# Patient Record
Sex: Male | Born: 1935 | Race: White | Hispanic: No | Smoking: Never smoker
Health system: Southern US, Community
[De-identification: ages and names within clinical notes are randomized; demographics above are authoritative.]

## PROBLEM LIST (undated history)

## (undated) DIAGNOSIS — K625 Hemorrhage of anus and rectum: Secondary | ICD-10-CM

## (undated) DIAGNOSIS — I10 Essential (primary) hypertension: Secondary | ICD-10-CM

## (undated) DIAGNOSIS — I499 Cardiac arrhythmia, unspecified: Secondary | ICD-10-CM

## (undated) DIAGNOSIS — I4891 Unspecified atrial fibrillation: Secondary | ICD-10-CM

## (undated) DIAGNOSIS — K56609 Unspecified intestinal obstruction, unspecified as to partial versus complete obstruction: Secondary | ICD-10-CM

## (undated) HISTORY — PX: NO PAST SURGERIES: SHX2092

---

## 2005-09-13 ENCOUNTER — Emergency Department (HOSPITAL_COMMUNITY): Admission: EM | Admit: 2005-09-13 | Discharge: 2005-09-13 | Payer: Self-pay | Admitting: Emergency Medicine

## 2005-09-19 ENCOUNTER — Ambulatory Visit (HOSPITAL_COMMUNITY): Admission: RE | Admit: 2005-09-19 | Discharge: 2005-09-19 | Payer: Self-pay | Admitting: Internal Medicine

## 2012-06-17 ENCOUNTER — Inpatient Hospital Stay (HOSPITAL_COMMUNITY)
Admission: EM | Admit: 2012-06-17 | Discharge: 2012-06-20 | DRG: 379 | Disposition: A | Discharge status: Home / Self Care | Payer: Medicare Other | Attending: Internal Medicine | Admitting: Internal Medicine

## 2012-06-17 ENCOUNTER — Encounter (HOSPITAL_COMMUNITY): Payer: Self-pay | Admitting: *Deleted

## 2012-06-17 DIAGNOSIS — N400 Enlarged prostate without lower urinary tract symptoms: Secondary | ICD-10-CM | POA: Diagnosis present

## 2012-06-17 DIAGNOSIS — D649 Anemia, unspecified: Secondary | ICD-10-CM | POA: Diagnosis present

## 2012-06-17 DIAGNOSIS — K620 Anal polyp: Secondary | ICD-10-CM | POA: Diagnosis present

## 2012-06-17 DIAGNOSIS — I4891 Unspecified atrial fibrillation: Secondary | ICD-10-CM

## 2012-06-17 DIAGNOSIS — K5731 Diverticulosis of large intestine without perforation or abscess with bleeding: Principal | ICD-10-CM | POA: Diagnosis present

## 2012-06-17 DIAGNOSIS — R509 Fever, unspecified: Secondary | ICD-10-CM | POA: Diagnosis present

## 2012-06-17 DIAGNOSIS — N401 Enlarged prostate with lower urinary tract symptoms: Secondary | ICD-10-CM | POA: Diagnosis present

## 2012-06-17 DIAGNOSIS — K922 Gastrointestinal hemorrhage, unspecified: Secondary | ICD-10-CM

## 2012-06-17 DIAGNOSIS — E876 Hypokalemia: Secondary | ICD-10-CM | POA: Diagnosis present

## 2012-06-17 DIAGNOSIS — R350 Frequency of micturition: Secondary | ICD-10-CM | POA: Diagnosis present

## 2012-06-17 DIAGNOSIS — N138 Other obstructive and reflux uropathy: Secondary | ICD-10-CM | POA: Diagnosis present

## 2012-06-17 DIAGNOSIS — Z7901 Long term (current) use of anticoagulants: Secondary | ICD-10-CM

## 2012-06-17 DIAGNOSIS — K625 Hemorrhage of anus and rectum: Secondary | ICD-10-CM

## 2012-06-17 DIAGNOSIS — I10 Essential (primary) hypertension: Secondary | ICD-10-CM | POA: Diagnosis present

## 2012-06-17 HISTORY — DX: Hemorrhage of anus and rectum: K62.5

## 2012-06-17 HISTORY — DX: Cardiac arrhythmia, unspecified: I49.9

## 2012-06-17 HISTORY — DX: Unspecified atrial fibrillation: I48.91

## 2012-06-17 HISTORY — DX: Essential (primary) hypertension: I10

## 2012-06-17 LAB — COMPREHENSIVE METABOLIC PANEL
Alkaline Phosphatase: 46 U/L (ref 39–117)
Chloride: 102 mEq/L (ref 96–112)
GFR calc Af Amer: 78 mL/min — ABNORMAL LOW (ref >90)
Sodium: 141 mEq/L (ref 135–145)
Total Protein: 6.7 g/dL (ref 6.0–8.3)

## 2012-06-17 LAB — CBC
Hemoglobin: 8.7 g/dL — ABNORMAL LOW (ref 13.0–17.0)
Hemoglobin: 7.5 g/dL — ABNORMAL LOW (ref 13.0–17.0)
MCH: 29.1 pg (ref 26.0–34.0)
MCHC: 34.1 g/dL (ref 30.0–36.0)
MCHC: 34.5 g/dL (ref 30.0–36.0)
MCHC: 34.4 g/dL (ref 30.0–36.0)
MCV: 85.6 fL (ref 78.0–100.0)
Platelets: 145 10*3/uL — ABNORMAL LOW (ref 150–400)
Platelets: 141 10*3/uL — ABNORMAL LOW (ref 150–400)
Platelets: 127 10*3/uL — ABNORMAL LOW (ref 150–400)
Platelets: 129 10*3/uL — ABNORMAL LOW (ref 150–400)
RBC: 2.52 MIL/uL — ABNORMAL LOW (ref 4.22–5.81)
RBC: 2.56 MIL/uL — ABNORMAL LOW (ref 4.22–5.81)
RDW: 13.6 % (ref 11.5–15.5)
RDW: 13.7 % (ref 11.5–15.5)
WBC: 7.9 10*3/uL (ref 4.0–10.5)
WBC: 8 10*3/uL (ref 4.0–10.5)

## 2012-06-17 LAB — SAMPLE TO BLOOD BANK

## 2012-06-17 LAB — CBC WITH DIFFERENTIAL/PLATELET
HCT: 30.7 % — ABNORMAL LOW (ref 39.0–52.0)
Hemoglobin: 10.4 g/dL — ABNORMAL LOW (ref 13.0–17.0)
Lymphs Abs: 1.5 10*3/uL (ref 0.7–4.0)
MCH: 28.8 pg (ref 26.0–34.0)
MCHC: 33.9 g/dL (ref 30.0–36.0)
MCV: 85 fL (ref 78.0–100.0)
Monocytes Absolute: 0.4 10*3/uL (ref 0.1–1.0)
Platelets: 149 10*3/uL — ABNORMAL LOW (ref 150–400)
RBC: 3.61 MIL/uL — ABNORMAL LOW (ref 4.22–5.81)
RDW: 13.4 % (ref 11.5–15.5)
WBC: 7.6 10*3/uL (ref 4.0–10.5)

## 2012-06-17 LAB — PROTIME-INR
INR: 2.84 — ABNORMAL HIGH (ref 0.00–1.49)
INR: 3.07 — ABNORMAL HIGH (ref 0.00–1.49)
Prothrombin Time: 28.4 seconds — ABNORMAL HIGH (ref 11.6–15.2)
Prothrombin Time: 30.1 seconds — ABNORMAL HIGH (ref 11.6–15.2)

## 2012-06-17 MED ORDER — ONDANSETRON HCL 4 MG PO TABS: 4.0000 mg | ORAL_TABLET | Freq: Four times a day (QID) | ORAL | Status: DC | PRN

## 2012-06-17 MED ORDER — VITAMIN K1 10 MG/ML IJ SOLN
10.0000 mg | Freq: Once | INTRAMUSCULAR | Status: AC
  Administered 2012-06-17: 10 mg via SUBCUTANEOUS
  Filled 2012-06-17: qty 1

## 2012-06-17 MED ORDER — ACETAMINOPHEN 325 MG PO TABS
650.0000 mg | ORAL_TABLET | Freq: Four times a day (QID) | ORAL | Status: DC | PRN
  Administered 2012-06-19: 650 mg via ORAL
  Filled 2012-06-17: qty 2

## 2012-06-17 MED ORDER — SODIUM CHLORIDE 0.9 % IV SOLN: INTRAVENOUS | Status: AC

## 2012-06-17 MED ORDER — PEG 3350-KCL-NA BICARB-NACL 420 G PO SOLR
4000.0000 mL | Freq: Once | ORAL | Status: AC
  Administered 2012-06-17: 4000 mL via ORAL
  Filled 2012-06-17: qty 4000

## 2012-06-17 MED ORDER — ACETAMINOPHEN 650 MG RE SUPP: 650.0000 mg | Freq: Four times a day (QID) | RECTAL | Status: DC | PRN

## 2012-06-17 MED ORDER — ALUM & MAG HYDROXIDE-SIMETH 200-200-20 MG/5ML PO SUSP
30.0000 mL | Freq: Four times a day (QID) | ORAL | Status: DC | PRN
  Filled 2012-06-17: qty 30

## 2012-06-17 MED ORDER — ONDANSETRON HCL 4 MG/2ML IJ SOLN: 4.0000 mg | Freq: Four times a day (QID) | INTRAMUSCULAR | Status: DC | PRN

## 2012-06-17 MED ORDER — VERAPAMIL HCL ER 240 MG PO TBCR
240.0000 mg | EXTENDED_RELEASE_TABLET | Freq: Every day | ORAL | Status: DC
  Administered 2012-06-19: 240 mg via ORAL
  Filled 2012-06-17 (×4): qty 1

## 2012-06-17 MED ORDER — PHYTONADIONE 1 MG/0.5 ML ORAL SOLUTION
1.0000 mg | Freq: Once | ORAL | Status: AC
  Administered 2012-06-17: 1 mg via ORAL
  Filled 2012-06-17: qty 0.5

## 2012-06-17 MED ORDER — SODIUM CHLORIDE 0.9 % IV SOLN
INTRAVENOUS | Status: DC
  Administered 2012-06-17 – 2012-06-18 (×2): via INTRAVENOUS

## 2012-06-17 NOTE — ED Notes (Signed)
PT is here with rectal bleeding that started yesterday evening.  Pt is on coumadin.  No abdominal pain

## 2012-06-17 NOTE — ED Provider Notes (Signed)
History     CSN: [id number]  Arrival date & time 06/17/12  0702   First MD Initiated Contact with Patient 06/17/12 0719      Chief Complaint  Patient presents with  . Rectal Bleeding    (Consider location/radiation/quality/duration/timing/severity/associated sxs/prior treatment) Patient is a 79 y.o. male presenting with hematochezia. The history is provided by the patient.  Rectal Bleeding  The current episode started yesterday. Pertinent negatives include no abdominal pain, no diarrhea, no nausea, no vomiting, no chest pain, no headaches and no rash.   patient presents rectal bleeding last night. States initially the stool with some blood. Now is more red blood on its own. He is without pain. He is on Coumadin for it to fibrillation, however he states his level stays between 22 and 2.8. No other bleeding. No chest pain or lightheadedness. No abdominal pain. He states he has had some episodes of bleeding like this previously but that was resolved on their own. No weight loss. The  Past Medical History  Diagnosis Date  . Hypertension   . Arrhythmia   . Atrial fibrillation   . Rectal bleeding 06/17/2012    Past Surgical History  Procedure Date  . None   . No past surgeries     Family History  Problem Relation Age of Onset  . Diabetes Brother     History  Substance Use Topics  . Smoking status: Never Smoker   . Smokeless tobacco: Never Used  . Alcohol Use: Yes     Comment: rare      Review of Systems  Constitutional: Negative for activity change and appetite change.  HENT: Negative for neck stiffness.   Eyes: Negative for pain.  Respiratory: Negative for chest tightness and shortness of breath.   Cardiovascular: Negative for chest pain and leg swelling.  Gastrointestinal: Positive for blood in stool, hematochezia and anal bleeding. Negative for nausea, vomiting, abdominal pain and diarrhea.  Genitourinary: Negative for flank pain.  Musculoskeletal: Negative for  back pain.  Skin: Negative for rash.  Neurological: Negative for weakness, numbness and headaches.  Psychiatric/Behavioral: Negative for behavioral problems.    Allergies  Review of patient's allergies indicates no known allergies.  Home Medications  No current outpatient prescriptions on file.  BP 145/75  Pulse 76  Temp 97.8 F (36.6 C) (Oral)  Resp 20  Ht 5' 10.5" (1.791 m)  Wt 215 lb 9.8 oz (97.8 kg)  BMI 30.50 kg/m2  SpO2 100%  Physical Exam  Nursing note and vitals reviewed. Constitutional: He is oriented to person, place, and time. He appears well-developed and well-nourished.  HENT:  Head: Normocephalic and atraumatic.  Eyes: EOM are normal. Pupils are equal, round, and reactive to light.  Neck: Normal range of motion. Neck supple.  Cardiovascular: Normal rate, regular rhythm and normal heart sounds.   No murmur heard. Pulmonary/Chest: Effort normal and breath sounds normal.  Abdominal: Soft. Bowel sounds are normal. He exhibits no distension and no mass. There is no tenderness. There is no rebound and no guarding.  Musculoskeletal: Normal range of motion. He exhibits no edema.  Neurological: He is alert and oriented to person, place, and time. No cranial nerve deficit.  Skin: Skin is warm and dry.  Psychiatric: He has a normal mood and affect.    ED Course  Procedures (including critical care time)  Labs Reviewed  CBC WITH DIFFERENTIAL - Abnormal; Notable for the following:    RBC 3.61 (*)     Hemoglobin 10.4 (*)  HCT 30.7 (*)     Platelets 149 (*)     All other components within normal limits  COMPREHENSIVE METABOLIC PANEL - Abnormal; Notable for the following:    Potassium 3.4 (*)     Glucose, Bld 158 (*)     GFR calc non Af Amer 67 (*)     GFR calc Af Amer 78 (*)     All other components within normal limits  PROTIME-INR - Abnormal; Notable for the following:    Prothrombin Time 28.4 (*)     INR 2.84 (*)     All other components within normal  limits  CBC - Abnormal; Notable for the following:    RBC 3.26 (*)     Hemoglobin 9.5 (*)     HCT 27.9 (*)     Platelets 145 (*)     All other components within normal limits  SAMPLE TO BLOOD BANK  CBC  CBC   No results found.   1. GI bleed   2. Rectal bleeding   3. Anemia   4. Atrial fibrillation       MDM  Patient presents with GI bleed. Hemoglobin is barely below normal, however he is on Coumadin for atrial fibrillation. Vitals are reassuring, however with the anticoagulation I feel he needs admission for further evaluation and treatment.        Juliet Rude. Rubin Payor, MD 06/17/12 [id number]

## 2012-06-17 NOTE — ED Notes (Signed)
Admitting physician at the bedside.

## 2012-06-17 NOTE — Consult Note (Signed)
Referring Provider: Dr. Brendia Sacks Primary Care Physician:  Georgann Housekeeper, MD Primary Gastroenterologist:  Gentry Fitz  Reason for Consultation:  Hematochezia  HPI: Ricky Guzman is a 79 y.o. male admitted to the hospital today following a roughly 24 hour history of intermittent painless hematochezia, several episodes, not associated with syncope. It sounds as though he he may have had a flexible sigmoidoscopy in the distant past, perhaps 15 years ago, but he does not know if he has diverticular disease and I do not see any radiographic studies such as CT scans which would show whether or not diverticular disease is present. He presented with a therapeutic INR of 2.8  (on Coumadin for A. Fib) and hemoglobin of 10.4, as compared to his usual baseline in the mid-12's, most recently checked in the office of his primary physician about a year ago. BUN normal, arguing against an upper tract source of bleeding, and he is not on any ulcerogenic medications.   Past Medical History  Diagnosis Date  . Hypertension   . Arrhythmia   . Atrial fibrillation   . Rectal bleeding 06/17/2012    Past Surgical History  Procedure Date  . None   . No past surgeries     Prior to Admission medications   Medication Sig Start Date End Date Taking? Authorizing Provider  benazepril-hydrochlorthiazide (LOTENSIN HCT) 20-12.5 MG per tablet Take 1 tablet by mouth daily.   Yes Historical Provider, MD  terazosin (HYTRIN) 10 MG capsule Take 10 mg by mouth at bedtime.   Yes Historical Provider, MD  verapamil (CALAN-SR) 240 MG CR tablet Take 240 mg by mouth at bedtime.   Yes Historical Provider, MD  warfarin (COUMADIN) 5 MG tablet Take 2.5-5 mg by mouth daily. 5 mg on all days except 2.5 mg on Monday, Wednesday, and Friday   Yes Historical Provider, MD    Current Facility-Administered Medications  Medication Dose Route Frequency Provider Last Rate Last Dose  . 0.9 %  sodium chloride infusion   Intravenous STAT Juliet Rude. Pickering, MD      . 0.9 %  sodium chloride infusion   Intravenous Continuous Standley Brooking, MD 50 mL/hr at 06/17/12 1800    . acetaminophen (TYLENOL) tablet 650 mg  650 mg Oral Q6H PRN Standley Brooking, MD       Or  . acetaminophen (TYLENOL) suppository 650 mg  650 mg Rectal Q6H PRN Standley Brooking, MD      . alum & mag hydroxide-simeth (MAALOX/MYLANTA) 200-200-20 MG/5ML suspension 30 mL  30 mL Oral Q6H PRN Standley Brooking, MD      . ondansetron [facility]) tablet 4 mg  4 mg Oral Q6H PRN Standley Brooking, MD       Or  . ondansetron [facility]) injection 4 mg  4 mg Intravenous Q6H PRN Standley Brooking, MD      . polyethylene glycol-electrolytes (NuLYTELY/GoLYTELY) solution 4,000 mL  4,000 mL Oral Once Florencia Reasons, MD      . verapamil (CALAN-SR) CR tablet 240 mg  240 mg Oral QHS Standley Brooking, MD        Allergies as of 06/17/2012  . (No Known Allergies)    Family History  Problem Relation Age of Onset  . Diabetes Brother     History   Social History  . Marital Status: Married    Spouse Name: N/A    Number of Children: N/A  . Years of Education: N/A   Occupational History  . Not  on file.   Social History Main Topics  . Smoking status: Never Smoker   . Smokeless tobacco: Never Used  . Alcohol Use: Yes     Comment: rare  . Drug Use: No  . Sexually Active:    Other Topics Concern  . Not on file   Social History Narrative  . No narrative on file    Review of Systems: Negative for upper tract symptoms anorexia, or weight loss   Physical Exam: Vital signs in last 24 hours: Temp:  [97.6 F (36.4 C)-98.3 F (36.8 C)] 98.3 F (36.8 C) (01/13 1622) Pulse Rate:  [48-114] 76  (01/13 1622) Resp:  [18-20] 18  (01/13 1622) BP: (107-145)/(59-75) 107/59 mmHg (01/13 1622) SpO2:  [99 %-100 %] 100 % (01/13 1622) Weight:  [97.8 kg (215 lb 9.8 oz)] 97.8 kg (215 lb 9.8 oz) (01/13 1200) Last BM Date: 06/17/12 General:   Alert,  Well-developed, well-nourished,  pleasant and cooperative in NAD Head:  Normocephalic and atraumatic. Eyes:  Sclera clear, no icterus.    Lungs:  Clear throughout to auscultation.   No wheezes, crackles, or rhonchi. No evident respiratory distress. Heart:   Regular rate and  seemingly normal rhythm; no murmurs, clicks, rubs,  or gallops. Abdomen: somewhat adipose. Soft, nontender, and nondistended. No masses, hepatosplenomegaly or ventral hernias noted. Normal bowel sounds, without bruits, guarding, or rebound.   Rectal:   per ER physician, showed bright red blood Neurologic:  Alert and coherent;  grossly normal neurologically. Psych:   Alert and cooperative. Normal mood and affect.  Intake/Output from previous day:   Intake/Output this shift:    Lab Results:  Basename 06/17/12 1632 06/17/12 1122 06/17/12 0720  WBC 7.2 7.9 7.6  HGB 8.7* 9.5* 10.4*  HCT 25.2* 27.9* 30.7*  PLT 141* 145* 149*   BMET  Basename 06/17/12 0720  NA 141  K 3.4*  CL 102  CO2 24  GLUCOSE 158*  BUN 18  CREATININE 1.02  CALCIUM 9.5   LFT  Basename 06/17/12 0720  PROT 6.7  ALBUMIN 3.6  AST 15  ALT 8  ALKPHOS 46  BILITOT 0.5  BILIDIR --  IBILI --   PT/INR  Basename 06/17/12 0720  LABPROT 28.4*  INR 2.84*    Studies/Results: No results found.  Impression: The clinical presentation is very compatible with a diverticular bleed. Vascular ectasia on an anticoagulated patient would be another possibility, and, much less likely, neoplasia.  Plan:  colonoscopy tomorrow. The nature, purpose, and risks have been reviewed with the patient and he is agreeable to proceed.   LOS: 0 days   Qamar Rosman V  06/17/2012, 7:03 PM

## 2012-06-17 NOTE — ED Notes (Signed)
Pt ambulated back from the BR. Dark red blood present in the commode. Dr. Rubin Payor made aware.

## 2012-06-17 NOTE — ED Notes (Signed)
Dr. Rubin Payor back in the family and patient.

## 2012-06-17 NOTE — Progress Notes (Signed)
Received pt. From emergency room.pt. Alert and oriented.pt. From home alone.skin intact.pt. Does not uses any equipment to move at home.

## 2012-06-17 NOTE — Progress Notes (Signed)
Pt. Has used the bathroom twice since got to the floor,the second time fresh red blood was witness by the RN.MD. Was notified.keep monitoring pt. Closely and assessing his needs.

## 2012-06-17 NOTE — ED Notes (Signed)
PATIENT HAS ARRIVED ON POD C. HIS BED IS READY.

## 2012-06-17 NOTE — ED Notes (Signed)
[facility] HAS CALLED REPORT. PT WILL BE TAKEN TO HIS ROOM NOW

## 2012-06-17 NOTE — H&P (Signed)
History and Physical  Ricky Guzman ZOX:[id number] DOB: November 30, 1930 DOA: 06/17/2012  Referring physician: Benjiman Core, MD PCP: Georgann Housekeeper, MD   Chief Complaint: Rectal bleeding  HPI:  79 year old man on warfarin for atrial fibrillation presented with two-day history of rectal bleeding and was referred for admission.  History obtained from patient who is an excellent historian. Symptoms began yesterday with 2 episodes of rectal bleeding, somewhat dark blood. Throughout the night he had several more episodes with bowel movements. No abdominal pain. No nausea or vomiting. He has had rectal bleeding in the past which was self-limited. He reports colonoscopy sometime in the past at [facility] although he cannot recall the date. Denies history of hemorrhoids or diverticulosis. He has never had a blood clot or stroke.  In the emergency department noted to be afebrile with stable vital signs. Guaiac was grossly positive. Hemoglobin was 10.4 with no baseline to refer to. INR 2.84.  Review of Systems:  Negative for fever, visual changes, sore throat, rash, new muscle aches, chest pain, SOB, dysuria, n/v/abdominal pain.   Past Medical History  Diagnosis Date  . Hypertension   . Arrhythmia   . Atrial fibrillation     Past Surgical History  Procedure Date  . None     Social History:  reports that he has never smoked. He does not have any smokeless tobacco history on file. He reports that he drinks alcohol. He reports that he does not use illicit drugs.  No Known Allergies  Family History  Problem Relation Age of Onset  . Diabetes Brother      Prior to Admission medications   Medication Sig Start Date End Date Taking? Authorizing Provider  benazepril-hydrochlorthiazide (LOTENSIN HCT) 20-12.5 MG per tablet Take 1 tablet by mouth daily.   Yes Historical Provider, MD  terazosin (HYTRIN) 10 MG capsule Take 10 mg by mouth at bedtime.   Yes Historical Provider, MD  verapamil (CALAN-SR) 240  MG CR tablet Take 240 mg by mouth at bedtime.   Yes Historical Provider, MD  warfarin (COUMADIN) 5 MG tablet Take 2.5-5 mg by mouth daily. 5 mg on all days except 2.5 mg on Monday, Wednesday, and Friday   Yes Historical Provider, MD   Physical Exam: Filed Vitals:   06/17/12 0708 06/17/12 0800 06/17/12 0834  BP: 136/75 120/68 122/63  Pulse: 89 67 71  Temp: 97.6 F (36.4 C)    TempSrc: Oral    Resp: 18  20  SpO2: 100% 99% 100%    General:  Examined in the emergency department. Appears calm and comfortable Eyes: PERRL, normal lids, irises  ENT: Moderately hard of hearing, normal lips & tongue Neck: no LAD, masses or thyromegaly Cardiovascular: RRR, no m/r/g. No LE edema. Respiratory: CTA bilaterally, no w/r/r. Normal respiratory effort. Abdomen: soft, ntnd GU: Anus appears unremarkable. No hemorrhoids. Gross red blood with rectal examination. Prostate feels unremarkable. Skin: no rash or induration seen on limited exam Musculoskeletal: grossly normal tone BUE/BLE Psychiatric: grossly normal mood and affect, speech fluent and appropriate Neurologic: grossly non-focal.  Wt Readings from Last 3 Encounters:  No data found for Wt    Labs on Admission:  Basic Metabolic Panel:  Lab 06/17/12 8119  NA 141  K 3.4*  CL 102  CO2 24  GLUCOSE 158*  BUN 18  CREATININE 1.02  CALCIUM 9.5  MG --  PHOS --    Liver Function Tests:  Lab 06/17/12 0720  AST 15  ALT 8  ALKPHOS 46  BILITOT 0.5  PROT 6.7  ALBUMIN 3.6    CBC:  Lab 06/17/12 0720  WBC 7.6  NEUTROABS 5.5  HGB 10.4*  HCT 30.7*  MCV 85.0  PLT 149*    Radiological Exams on Admission: No results found.     Principal Problem:  *Rectal bleeding Active Problems:  Anemia  Atrial fibrillation   Assessment/Plan 1. Rectal bleeding: Serial CBC, GI consultation placed with [facility], IV fluids. 2. Anemia: No baseline. Acute blood loss anemia suspected. Serial CBC. Transfuse as needed for continued bleeding for  significant drop. 3. Atrial fibrillation: Discontinue warfarin. Continue verapamil. Low-dose vitamin K given history of bleeding and anemia. No history of stroke or blood clot. I did counsel the patient and family that stopping warfarin and partially reversing INR does involve a risk of stroke which they accepted given the risk-benefit of the current situation.  I have notified the patient's primary care physician of admission, assume care 1/14.  Code Status: Full code Family Communication: discussed with 2 daughters at bedside Disposition Plan/Anticipated LOS: admit for observation, 1-2 days  Time spent: 60 minutes  Brendia Sacks, MD  [facility] Pager [phone] 06/17/2012, 8:56 AM

## 2012-06-18 ENCOUNTER — Encounter (HOSPITAL_COMMUNITY): Payer: Self-pay | Admitting: *Deleted

## 2012-06-18 ENCOUNTER — Encounter (HOSPITAL_COMMUNITY)
Admission: EM | Disposition: A | Discharge status: Home / Self Care | Payer: Self-pay | Source: Home / Self Care | Attending: Internal Medicine

## 2012-06-18 DIAGNOSIS — I10 Essential (primary) hypertension: Secondary | ICD-10-CM

## 2012-06-18 HISTORY — PX: COLONOSCOPY: SHX5424

## 2012-06-18 LAB — CBC
MCH: 28.5 pg (ref 26.0–34.0)
Platelets: 116 10*3/uL — ABNORMAL LOW (ref 150–400)
RBC: 2.14 MIL/uL — ABNORMAL LOW (ref 4.22–5.81)
RDW: 13.9 % (ref 11.5–15.5)
WBC: 8.2 10*3/uL (ref 4.0–10.5)

## 2012-06-18 LAB — BASIC METABOLIC PANEL
CO2: 23 mEq/L (ref 19–32)
Chloride: 103 mEq/L (ref 96–112)
GFR calc Af Amer: 88 mL/min — ABNORMAL LOW (ref >90)
Potassium: 3.2 mEq/L — ABNORMAL LOW (ref 3.5–5.1)
Sodium: 139 mEq/L (ref 135–145)

## 2012-06-18 LAB — PROTIME-INR
INR: 1.94 — ABNORMAL HIGH (ref 0.00–1.49)
Prothrombin Time: 21.4 seconds — ABNORMAL HIGH (ref 11.6–15.2)
Prothrombin Time: 20.1 seconds — ABNORMAL HIGH (ref 11.6–15.2)

## 2012-06-18 SURGERY — COLONOSCOPY
Anesthesia: Moderate Sedation

## 2012-06-18 MED ORDER — MIDAZOLAM HCL 5 MG/5ML IJ SOLN
INTRAMUSCULAR | Status: DC | PRN
  Administered 2012-06-18 (×2): 2 mg via INTRAVENOUS
  Administered 2012-06-18: 1 mg via INTRAVENOUS

## 2012-06-18 MED ORDER — FENTANYL CITRATE 0.05 MG/ML IJ SOLN
INTRAMUSCULAR | Status: AC
  Filled 2012-06-18: qty 2

## 2012-06-18 MED ORDER — MIDAZOLAM HCL 5 MG/ML IJ SOLN
INTRAMUSCULAR | Status: AC
  Filled 2012-06-18: qty 2

## 2012-06-18 MED ORDER — FENTANYL CITRATE 0.05 MG/ML IJ SOLN
INTRAMUSCULAR | Status: DC | PRN
  Administered 2012-06-18 (×3): 25 ug via INTRAVENOUS

## 2012-06-18 MED ORDER — POTASSIUM CHLORIDE 10 MEQ/100ML IV SOLN
10.0000 meq | INTRAVENOUS | Status: AC
  Administered 2012-06-18 (×2): 10 meq via INTRAVENOUS
  Filled 2012-06-18 (×2): qty 100

## 2012-06-18 MED ORDER — POTASSIUM CHLORIDE 10 MEQ/100ML IV SOLN
10.0000 meq | INTRAVENOUS | Status: AC
  Filled 2012-06-18 (×2): qty 100

## 2012-06-18 NOTE — Progress Notes (Signed)
Colonoscopy was very well tolerated. Please see dictated procedure report for details.  The patient has pancolonic diverticulosis, but had no bleeding at the time of the procedure, so it appears he has stopped.   However, in my experience, it is not uncommon for these patients to restart after a day or 2, so I would recommend keeping him in the hospital at least until Thursday morning.  A small polyp was removed and I will contact the patient with the pathology report on it, but in view of his advanced age, I do not anticipate him needing a followup colonoscopy, regardless of the histology of the polyp.   Okay to resume anticoagulation at any time if needed. However, there is a moderate advantage to keeping him off Coumadin for a week or so to allow the presumed clot on his diverticulum to fully mature, and it would be preferable in the event of an early rebleed. However, the risk of re-bleeding has to be balanced against the risk of having him without anticoagulation.  I will leave this to the discretion of his attending physician.  Florencia Reasons, M.D. [phone]

## 2012-06-18 NOTE — Op Note (Signed)
[facility] [address] [place], [address]   COLONOSCOPY PROCEDURE REPORT  PATIENT: Ricky Guzman, Ricky Guzman  MR#: [id number] BIRTHDATE: 21-Dec-1930 , 82  yrs. old GENDER: Male ENDOSCOPIST: Bernette Redbird, MD REFERRED BY:   Dr. Georgann Housekeeper PROCEDURE DATE:  06/18/2012 PROCEDURE:     colonoscopy with polypectomy ASA CLASS: INDICATIONS:  significant hematochezia requiring transfusion MEDICATIONS:    fentanyl 75 mcg, Versed 5 mg IV  DESCRIPTION OF PROCEDURE: the patient was brought from his hospital room to the endoscopy unit after a Nulytely prep, having provided written consent after discussion of the risks of the procedure. Time-out was performed and the patient was sedatedand remained stable throughout the procedure. Digital exam of the prostate was normal.  The Pentax adult video colonoscope was easily advanced to the cecum as identified by visualization of appendiceal orifice and ileocecal valve and pullback was then performed. The quality of the prep was excellent it is felt that all areas were well seen.  There was no blood whatsoever in the colonic lumen, consistent with the patient's report that his stools had cleared during the early part of the prep yesterday evening.  The patient had multiple diverticula throughout the colon, most concentrated in the sigmoid region and, to a lesser degree, in the proximal colon.  There was a small, 5 mm semi-pedunculated polyp in the distal rectum seen on retroflexed view in, removed by cold snare technique and touched up with the cold biopsy forceps to the point of complete excision, with minimal bleeding.  The patient's INR at the time this procedure was 1.7, with partial reversal of his Coumadin at the time of admission.  The patient tolerated the procedure well.    COMPLICATIONS: None  ENDOSCOPIC IMPRESSION:  1. No active bleeding at time of procedure or blood in the colonic lumen 2. Multiple  diverticula throughout the colon, which are the presumed source of patient's bleeding 3. Small distal rectal polyp removed as described above  RECOMMENDATIONS:  1. Await pathology on the polyp. However, in view of the patient's advanced age and the small size of the polyp, I do not feel that surveillance colonoscopy would be needed, even if the polyp were adenomatous in character  2. Okay to advance diet  3. Consider discharge in another 36 hours, keeping in mind that diverticular bleeding is sometimes quiescent for a day or 2, and then will restart    _______________________________ eSigned:  Bernette Redbird, MD 06/18/2012 4:45 PM     PATIENT NAME:  Ricky Guzman, Ricky Guzman MR#: [id number]

## 2012-06-18 NOTE — Progress Notes (Signed)
Subjective: Pt with bowel prep; dark stool No N/V/Abdominal pain, no CP, no SOB   Objective: Vital signs in last 24 hours: Temp:  [97.1 F (36.2 C)-98.6 F (37 C)] 98.4 F (36.9 C) (01/14 0539) Pulse Rate:  [48-114] 106  (01/14 0539) Resp:  [16-20] 16  (01/14 0539) BP: (107-145)/(59-85) 136/81 mmHg (01/14 0539) SpO2:  [98 %-100 %] 100 % (01/14 0447) Weight:  [97.5 kg (214 lb 15.2 oz)-97.8 kg (215 lb 9.8 oz)] 97.5 kg (214 lb 15.2 oz) (01/13 2217) Weight change:  Last BM Date: 06/17/12  Intake/Output from previous day: 01/13 0701 - 01/14 0700 In: 1240 [P.O.:740; I.V.:500] Out: -  Intake/Output this shift:    General appearance: alert Resp: clear to auscultation bilaterally Cardio:Irregular irregular, mild tacycardia GI: soft, non-tender; bowel sounds normal; no masses,  no organomegaly  Lab Results:  [facility] 06/17/12 2232 06/17/12 1632  WBC 8.08.0 7.2  HGB 7.5*7.5* 8.7*  HCT 21.5*21.8* 25.2*  PLT 129*127* 141*   BMET  Basename 06/17/12 0720  NA 141  K 3.4*  CL 102  CO2 24  GLUCOSE 158*  BUN 18  CREATININE 1.02  CALCIUM 9.5    Studies/Results: No results found.  Medications: I have reviewed the patient's current medications.  Assessment/Plan: LGI bleed: DDX diverticular, AVM- colon eval with GI Anemia: loss of blood- transfuse 2 unit PRBC Anticoagulation: inr 3.0- getting FFP; recheck INR after FFP- if still over 1.5 consider FFP/ Vit K HTN: BP med- stable Afib: rate control    LOS: 1 day   Ladarryl Wrage 06/18/2012, 8:02 AM

## 2012-06-18 NOTE — Progress Notes (Signed)
Called Dr Arthur Holms about patient temperature of 100.52F and the patient refusing to take medications. No new orders.

## 2012-06-18 NOTE — Progress Notes (Signed)
Patient had a loose bowel movement that is dark red d/t frank red blood.  Abdomen is soft with audible bowel sounds and no tenderness.  He is now back in bed resting.  Currently getting FFP.  Will continue to monitor

## 2012-06-19 ENCOUNTER — Encounter (HOSPITAL_COMMUNITY): Payer: Self-pay | Admitting: Gastroenterology

## 2012-06-19 LAB — URINALYSIS, ROUTINE W REFLEX MICROSCOPIC
Bilirubin Urine: NEGATIVE
Ketones, ur: NEGATIVE mg/dL
Nitrite: NEGATIVE
Protein, ur: NEGATIVE mg/dL
Urobilinogen, UA: 1 mg/dL (ref 0.0–1.0)
pH: 8 (ref 5.0–8.0)

## 2012-06-19 LAB — PREPARE FRESH FROZEN PLASMA: Unit division: 0

## 2012-06-19 LAB — CBC
HCT: 22.8 % — ABNORMAL LOW (ref 39.0–52.0)
MCH: 29.3 pg (ref 26.0–34.0)
MCHC: 34.6 g/dL (ref 30.0–36.0)
MCV: 84.4 fL (ref 78.0–100.0)
Platelets: 117 10*3/uL — ABNORMAL LOW (ref 150–400)
RDW: 14.4 % (ref 11.5–15.5)

## 2012-06-19 LAB — BASIC METABOLIC PANEL
BUN: 10 mg/dL (ref 6–23)
Calcium: 7.8 mg/dL — ABNORMAL LOW (ref 8.4–10.5)
Creatinine, Ser: 0.98 mg/dL (ref 0.50–1.35)
GFR calc Af Amer: 88 mL/min — ABNORMAL LOW (ref >90)
GFR calc non Af Amer: 76 mL/min — ABNORMAL LOW (ref >90)

## 2012-06-19 MED ORDER — TERAZOSIN HCL 5 MG PO CAPS
10.0000 mg | ORAL_CAPSULE | Freq: Every day | ORAL | Status: DC
  Administered 2012-06-19: 10 mg via ORAL
  Filled 2012-06-19 (×2): qty 2

## 2012-06-19 MED ORDER — POTASSIUM CHLORIDE CRYS ER 20 MEQ PO TBCR
40.0000 meq | EXTENDED_RELEASE_TABLET | Freq: Once | ORAL | Status: AC
  Administered 2012-06-19: 40 meq via ORAL
  Filled 2012-06-19: qty 2

## 2012-06-19 NOTE — Progress Notes (Signed)
Subjective: Feel better No pain. Low grade fever C/o frequent urine  Objective: Vital signs in last 24 hours: Temp:  [98.1 F (36.7 C)-100.8 F (38.2 C)] 100.2 F (37.9 C) (01/15 0509) Pulse Rate:  [99-105] 102  (01/15 0509) Resp:  [17-31] 17  (01/15 0509) BP: (135-168)/(76-99) 168/91 mmHg (01/15 0509) SpO2:  [97 %-100 %] 98 % (01/15 0509) Weight:  [97.9 kg (215 lb 13.3 oz)] 97.9 kg (215 lb 13.3 oz) (01/14 2114) Weight change: 0.1 kg (3.5 oz) Last BM Date: 06/18/12  Intake/Output from previous day: 01/14 0701 - 01/15 0700 In: 875 [I.V.:875] Out: 1650 [Urine:1650] Intake/Output this shift:    General appearance: alert Resp: clear to auscultation bilaterally Cardio: irregularly irregular rhythm GI: soft, non-tender; bowel sounds normal; no masses,  no organomegaly  Lab Results:  [facility] 06/18/12 0630 06/17/12 2232  WBC 8.2 8.08.0  HGB 6.1* 7.5*7.5*  HCT 18.3* 21.5*21.8*  PLT 116* 129*127*   BMET  Basename 06/18/12 0630 06/17/12 0720  NA 139 141  K 3.2* 3.4*  CL 103 102  CO2 23 24  GLUCOSE 117* 158*  BUN 18 18  CREATININE 0.96 1.02  CALCIUM 8.3* 9.5    Studies/Results: No results found.  Medications: I have reviewed the patient's current medications.  Assessment/Plan: LGI bleed: diverticular, - colon eval noted from GI and Recommendation Anemia: loss of blood- transfuse 2 unit PRBC-  Yesterday- CBC pending today keep HGB > 8  Anticoagulation: stay off Coumadin for at least 10 days- risk benefit explained to patient- risk of bleeding out ways risk of stoke HTN: BP med-  Lotensin/HCTZ on hold Afib: rate control  Fever/ urine freq- h/o BPH- restart hytrin- check u/a for infection Regular diet Possible d/c tomorrow Low K replace   LOS: 2 days   Ricky Guzman 06/19/2012, 7:58 AM

## 2012-06-19 NOTE — Progress Notes (Signed)
GASTROENTEROLOGY PROGRESS NOTE  Problem:   GI bleeding, probably diverticular. Posthemorrhagic anemia.  Subjective: Comfortable. No further bleeding. No bowel movements.  Objective: Appropriate posttransfusion rise in hemoglobin. Vital signs unremarkable apart from borderline fever yesterday  Assessment: Quiescent diverticular bleed  Plan: As per yesterday's note. Agree with plan to hold Coumadin temporarily with possible discharge tomorrow  Florencia Reasons, M.D. 06/19/2012 9:44 AM

## 2012-06-20 DIAGNOSIS — N400 Enlarged prostate without lower urinary tract symptoms: Secondary | ICD-10-CM

## 2012-06-20 LAB — TYPE AND SCREEN
ABO/RH(D): A POS
Antibody Screen: NEGATIVE
Unit division: 0
Unit division: 0

## 2012-06-20 LAB — BASIC METABOLIC PANEL
BUN: 10 mg/dL (ref 6–23)
Chloride: 108 mEq/L (ref 96–112)
GFR calc Af Amer: 88 mL/min — ABNORMAL LOW (ref >90)
GFR calc non Af Amer: 76 mL/min — ABNORMAL LOW (ref >90)
Glucose, Bld: 99 mg/dL (ref 70–99)
Potassium: 3.4 mEq/L — ABNORMAL LOW (ref 3.5–5.1)
Sodium: 144 mEq/L (ref 135–145)

## 2012-06-20 LAB — CBC
MCH: 29.4 pg (ref 26.0–34.0)
MCHC: 34.8 g/dL (ref 30.0–36.0)
MCV: 84.5 fL (ref 78.0–100.0)
RBC: 3.54 MIL/uL — ABNORMAL LOW (ref 4.22–5.81)
RDW: 14.9 % (ref 11.5–15.5)

## 2012-06-20 NOTE — Progress Notes (Signed)
Pt after visit summary reviewed and pt capable of re verbalizing medications and follow up appointments. Pt remains stable. No signs and symptoms of distress. Educated to return to ER in the event of SOB, dizziness, chest pain, or fainting. Johnnie Goynes, RN   

## 2012-06-20 NOTE — Discharge Summary (Signed)
Physician Discharge Summary  Patient ID: Ricky Guzman MRN: [id number] DOB/AGE: 1931-03-15 79 y.o.  Admit date: 06/17/2012 Discharge date: 06/20/2012  Admission Diagnoses:  Discharge Diagnoses:  Principal Problem:  *Rectal bleeding- diverticular Active Problems:  Anemia  Atrial fibrillation  Hypertension  BPH (benign prostatic hyperplasia)   Discharged Condition: good  Hospital Course: 79 years old male with history of atrial fibrillation hypertension and BPH presented with lower GI bleed.  Problem #1Lower GI bleed: patient was started IV fluids his white count was normal his hemoglobin dropped down to 6.1.. His BUN/creatinine was in the normal range. GI was consulted he underwent colonoscopy showed diverticular bleed at the time of colonoscopy there was no active bleeding. He received 4 units of packed red blood cells total, hemoglobin discharge 10.4. Normal white count. Patient did not any abdominal pain or vomiting or nausea. He tolerated p.o. Intake well No further bleeding noted He was anticoagulated which was reversed with FFP's and vitamin K; his last INR was 1.7 Problem #2: atrial fibrillation, blood pressure is stable rate control on verapamil. His Coumadin was held; INR was 1.7. Because of the severe lower GI bleed hold the Coumadin for at least one to 2 weeks and then restart back. Because of the high risk for rebleeding. Risk-benefit discussed. Problem #3: hypertension blood pressure medication was on hold. His blood pressure was 120s systolic will hold the blood pressure medication Lotensin until next week and reevaluate outpatient and restart that back Problem #4:hypokalemia,he received potassium replacement; last potassium on discharge 3.4.recheck potassium outpatient Problem #5: BPH restarted back on Hytrin Problem 6: low grade fever urinalysis was negative chest x-ray negative; fever resolved. Follow up outpatient: blood counts and potassium  Consults: GI  Significant  Diagnostic Studies: labs: Hemoglobin 10.4, potassium 3.4, white count 8.3 platelets 125 creatinine 0.9 INR 1.7,urinalysis negative radiology: CXR: normal and colonoscopy showed diverticulosis without any other finding  Treatments: IV hydration and blood products  Discharge Exam: Blood pressure 127/85, pulse 80, temperature 98.7 F (37.1 C), temperature source Oral, resp. rate 18, height 5' 10.5" (1.791 m), weight 97.898 kg (215 lb 13.2 oz), SpO2 99.00%. General appearance: alert Cardio: irregularly irregular rhythm GI: soft, non-tender; bowel sounds normal; no masses,  no organomegaly  Disposition: discharge home  Discharge Orders    Future Orders Please Complete By Expires   Diet - low sodium heart healthy      Increase activity slowly          Medication List     As of 06/20/2012  8:13 AM    STOP taking these medications         benazepril-hydrochlorthiazide 20-12.5 MG per tablet   Commonly known as: LOTENSIN HCT      warfarin 5 MG tablet   Commonly known as: COUMADIN      TAKE these medications         terazosin 10 MG capsule   Commonly known as: HYTRIN   Take 10 mg by mouth at bedtime.      verapamil 240 MG CR tablet   Commonly known as: CALAN-SR   Take 240 mg by mouth at bedtime.           Follow-up Information    Follow up with [facility], MD. In 7 days.   Contact information:   [address] [place] [address] [phone]        discharge planning time total 45 minutes  Signed: Maizey Menendez 06/20/2012, 8:13 AM

## 2012-06-20 NOTE — Care Management Note (Signed)
   CARE MANAGEMENT NOTE 06/20/2012  Patient:  Ricky Guzman, Ricky Guzman   Account Number:  [id number]  Date Initiated:  06/20/2012  Documentation initiated by:  Johny Shock  Subjective/Objective Assessment:   Order for [facility].     Action/Plan:   Met with pt who declined HHRN visit for  assessment and eval in the home. States that family is available to support and he will see Dr Donette Larry in 2weeks.   Anticipated DC Date:  06/20/2012   Anticipated DC Plan:  HOME/SELF CARE         PAC Choice  HOME HEALTH   Choice offered to / List presented to:  C-1 Patient           Status of service:  Completed, signed off Medicare Important Message given?   (If response is "NO", the following Medicare IM given date fields will be blank) Date Medicare IM given:   Date Additional Medicare IM given:    Discharge Disposition:  HOME/SELF CARE  Per UR Regulation:    If discussed at Long Length of Stay Meetings, dates discussed:    Comments:

## 2014-04-05 DIAGNOSIS — K56609 Unspecified intestinal obstruction, unspecified as to partial versus complete obstruction: Secondary | ICD-10-CM

## 2014-04-05 HISTORY — DX: Unspecified intestinal obstruction, unspecified as to partial versus complete obstruction: K56.609

## 2014-04-21 ENCOUNTER — Inpatient Hospital Stay (HOSPITAL_COMMUNITY): Payer: Medicare Other

## 2014-04-21 ENCOUNTER — Emergency Department (HOSPITAL_COMMUNITY): Payer: Medicare Other

## 2014-04-21 ENCOUNTER — Ambulatory Visit
Admission: RE | Admit: 2014-04-21 | Discharge: 2014-04-21 | Disposition: A | Discharge status: Home / Self Care | Payer: Medicare Other | Source: Ambulatory Visit | Attending: Internal Medicine | Admitting: Internal Medicine

## 2014-04-21 ENCOUNTER — Inpatient Hospital Stay (HOSPITAL_COMMUNITY)
Admission: EM | Admit: 2014-04-21 | Discharge: 2014-04-25 | DRG: 394 | Disposition: A | Discharge status: Home / Self Care | Payer: Medicare Other | Attending: Internal Medicine | Admitting: Internal Medicine

## 2014-04-21 ENCOUNTER — Encounter (HOSPITAL_COMMUNITY): Payer: Self-pay | Admitting: *Deleted

## 2014-04-21 ENCOUNTER — Other Ambulatory Visit: Payer: Self-pay | Admitting: Internal Medicine

## 2014-04-21 DIAGNOSIS — Z79899 Other long term (current) drug therapy: Secondary | ICD-10-CM

## 2014-04-21 DIAGNOSIS — R1084 Generalized abdominal pain: Secondary | ICD-10-CM

## 2014-04-21 DIAGNOSIS — K4090 Unilateral inguinal hernia, without obstruction or gangrene, not specified as recurrent: Secondary | ICD-10-CM | POA: Diagnosis present

## 2014-04-21 DIAGNOSIS — Z4659 Encounter for fitting and adjustment of other gastrointestinal appliance and device: Secondary | ICD-10-CM

## 2014-04-21 DIAGNOSIS — H409 Unspecified glaucoma: Secondary | ICD-10-CM | POA: Diagnosis present

## 2014-04-21 DIAGNOSIS — I482 Chronic atrial fibrillation, unspecified: Secondary | ICD-10-CM | POA: Insufficient documentation

## 2014-04-21 DIAGNOSIS — E669 Obesity, unspecified: Secondary | ICD-10-CM | POA: Diagnosis present

## 2014-04-21 DIAGNOSIS — D649 Anemia, unspecified: Secondary | ICD-10-CM | POA: Diagnosis present

## 2014-04-21 DIAGNOSIS — I10 Essential (primary) hypertension: Secondary | ICD-10-CM | POA: Diagnosis present

## 2014-04-21 DIAGNOSIS — E872 Acidosis, unspecified: Secondary | ICD-10-CM | POA: Diagnosis present

## 2014-04-21 DIAGNOSIS — Z6829 Body mass index (BMI) 29.0-29.9, adult: Secondary | ICD-10-CM

## 2014-04-21 DIAGNOSIS — K219 Gastro-esophageal reflux disease without esophagitis: Secondary | ICD-10-CM | POA: Diagnosis present

## 2014-04-21 DIAGNOSIS — I5032 Chronic diastolic (congestive) heart failure: Secondary | ICD-10-CM | POA: Diagnosis present

## 2014-04-21 DIAGNOSIS — K529 Noninfective gastroenteritis and colitis, unspecified: Secondary | ICD-10-CM | POA: Diagnosis present

## 2014-04-21 DIAGNOSIS — E876 Hypokalemia: Secondary | ICD-10-CM | POA: Diagnosis present

## 2014-04-21 DIAGNOSIS — Z7901 Long term (current) use of anticoagulants: Secondary | ICD-10-CM | POA: Diagnosis not present

## 2014-04-21 DIAGNOSIS — R509 Fever, unspecified: Secondary | ICD-10-CM | POA: Diagnosis present

## 2014-04-21 DIAGNOSIS — K5730 Diverticulosis of large intestine without perforation or abscess without bleeding: Secondary | ICD-10-CM | POA: Diagnosis present

## 2014-04-21 DIAGNOSIS — K56609 Unspecified intestinal obstruction, unspecified as to partial versus complete obstruction: Secondary | ICD-10-CM

## 2014-04-21 DIAGNOSIS — K5669 Other intestinal obstruction: Secondary | ICD-10-CM | POA: Diagnosis present

## 2014-04-21 DIAGNOSIS — K420 Umbilical hernia with obstruction, without gangrene: Principal | ICD-10-CM | POA: Diagnosis present

## 2014-04-21 HISTORY — DX: Unspecified intestinal obstruction, unspecified as to partial versus complete obstruction: K56.609

## 2014-04-21 LAB — COMPREHENSIVE METABOLIC PANEL
ALT: 10 U/L (ref 0–53)
AST: 17 U/L (ref 0–37)
Albumin: 3.7 g/dL (ref 3.5–5.2)
Alkaline Phosphatase: 59 U/L (ref 39–117)
Anion gap: 18 — ABNORMAL HIGH (ref 5–15)
BUN: 19 mg/dL (ref 6–23)
CO2: 25 mEq/L (ref 19–32)
Calcium: 9.8 mg/dL (ref 8.4–10.5)
Chloride: 96 mEq/L (ref 96–112)
Creatinine, Ser: 1.09 mg/dL (ref 0.50–1.35)
GFR calc Af Amer: 71 mL/min — ABNORMAL LOW (ref >90)
GFR calc non Af Amer: 62 mL/min — ABNORMAL LOW (ref >90)
Glucose, Bld: 142 mg/dL — ABNORMAL HIGH (ref 70–99)
Potassium: 3.4 mEq/L — ABNORMAL LOW (ref 3.7–5.3)
Sodium: 139 mEq/L (ref 137–147)
Total Bilirubin: 1.8 mg/dL — ABNORMAL HIGH (ref 0.3–1.2)
Total Protein: 8.1 g/dL (ref 6.0–8.3)

## 2014-04-21 LAB — CBC WITH DIFFERENTIAL/PLATELET
Basophils Absolute: 0 10*3/uL (ref 0.0–0.1)
Basophils Relative: 0 % (ref 0–1)
Eosinophils Absolute: 0 10*3/uL (ref 0.0–0.7)
Eosinophils Relative: 0 % (ref 0–5)
HCT: 43.6 % (ref 39.0–52.0)
Hemoglobin: 14.9 g/dL (ref 13.0–17.0)
Lymphocytes Relative: 6 % — ABNORMAL LOW (ref 12–46)
Lymphs Abs: 0.5 10*3/uL — ABNORMAL LOW (ref 0.7–4.0)
MCH: 29.4 pg (ref 26.0–34.0)
MCHC: 34.2 g/dL (ref 30.0–36.0)
MCV: 86 fL (ref 78.0–100.0)
Monocytes Absolute: 1 10*3/uL (ref 0.1–1.0)
Monocytes Relative: 11 % (ref 3–12)
Neutro Abs: 7.6 10*3/uL (ref 1.7–7.7)
Neutrophils Relative %: 83 % — ABNORMAL HIGH (ref 43–77)
Platelets: 195 10*3/uL (ref 150–400)
RBC: 5.07 MIL/uL (ref 4.22–5.81)
RDW: 13.6 % (ref 11.5–15.5)
WBC: 9.1 10*3/uL (ref 4.0–10.5)

## 2014-04-21 LAB — I-STAT CG4 LACTIC ACID, ED: Lactic Acid, Venous: 2.86 mmol/L — ABNORMAL HIGH (ref 0.5–2.2)

## 2014-04-21 LAB — PROTIME-INR
INR: 1.29 (ref 0.00–1.49)
Prothrombin Time: 16.2 seconds — ABNORMAL HIGH (ref 11.6–15.2)

## 2014-04-21 LAB — I-STAT TROPONIN, ED: Troponin i, poc: 0.01 ng/mL (ref 0.00–0.08)

## 2014-04-21 LAB — LIPASE, BLOOD: Lipase: 11 U/L (ref 11–59)

## 2014-04-21 MED ORDER — SODIUM CHLORIDE 0.9 % IJ SOLN
3.0000 mL | Freq: Two times a day (BID) | INTRAMUSCULAR | Status: DC
  Administered 2014-04-24 – 2014-04-25 (×2): 3 mL via INTRAVENOUS

## 2014-04-21 MED ORDER — ONDANSETRON HCL 4 MG/2ML IJ SOLN
4.0000 mg | Freq: Once | INTRAMUSCULAR | Status: AC
  Administered 2014-04-21: 4 mg via INTRAVENOUS
  Filled 2014-04-21: qty 2

## 2014-04-21 MED ORDER — PANTOPRAZOLE SODIUM 40 MG IV SOLR
40.0000 mg | INTRAVENOUS | Status: DC
  Administered 2014-04-21 – 2014-04-24 (×4): 40 mg via INTRAVENOUS
  Filled 2014-04-21 (×4): qty 40

## 2014-04-21 MED ORDER — DEXTROSE-NACL 5-0.9 % IV SOLN
INTRAVENOUS | Status: DC
  Administered 2014-04-21: 20:00:00 via INTRAVENOUS

## 2014-04-21 MED ORDER — LATANOPROST 0.005 % OP SOLN
1.0000 [drp] | Freq: Every day | OPHTHALMIC | Status: DC
  Administered 2014-04-21 – 2014-04-24 (×4): 1 [drp] via OPHTHALMIC
  Filled 2014-04-21: qty 2.5

## 2014-04-21 MED ORDER — SODIUM CHLORIDE 0.9 % IV BOLUS (SEPSIS)
1000.0000 mL | Freq: Once | INTRAVENOUS | Status: AC
  Administered 2014-04-21: 1000 mL via INTRAVENOUS

## 2014-04-21 MED ORDER — CHLORHEXIDINE GLUCONATE 0.12 % MT SOLN
15.0000 mL | Freq: Two times a day (BID) | OROMUCOSAL | Status: DC
  Administered 2014-04-21 – 2014-04-24 (×7): 15 mL via OROMUCOSAL
  Filled 2014-04-21 (×10): qty 15

## 2014-04-21 MED ORDER — CETYLPYRIDINIUM CHLORIDE 0.05 % MT LIQD
7.0000 mL | Freq: Two times a day (BID) | OROMUCOSAL | Status: DC
  Administered 2014-04-22 – 2014-04-24 (×3): 7 mL via OROMUCOSAL

## 2014-04-21 MED ORDER — METOPROLOL TARTRATE 1 MG/ML IV SOLN
2.5000 mg | Freq: Four times a day (QID) | INTRAVENOUS | Status: DC | PRN
  Filled 2014-04-21: qty 5

## 2014-04-21 MED ORDER — ONDANSETRON HCL 4 MG/2ML IJ SOLN: 4.0000 mg | Freq: Four times a day (QID) | INTRAMUSCULAR | Status: DC | PRN

## 2014-04-21 MED ORDER — ONDANSETRON HCL 4 MG PO TABS: 4.0000 mg | ORAL_TABLET | Freq: Four times a day (QID) | ORAL | Status: DC | PRN

## 2014-04-21 MED ORDER — ACETAMINOPHEN 650 MG RE SUPP
650.0000 mg | Freq: Four times a day (QID) | RECTAL | Status: DC | PRN
  Administered 2014-04-22 – 2014-04-23 (×2): 650 mg via RECTAL
  Filled 2014-04-21 (×2): qty 1

## 2014-04-21 MED ORDER — ONDANSETRON HCL 4 MG/2ML IJ SOLN: 4.0000 mg | Freq: Three times a day (TID) | INTRAMUSCULAR | Status: DC | PRN

## 2014-04-21 MED ORDER — IOHEXOL 300 MG/ML  SOLN
100.0000 mL | Freq: Once | INTRAMUSCULAR | Status: AC | PRN
  Administered 2014-04-21: 100 mL via INTRAVENOUS

## 2014-04-21 MED ORDER — MORPHINE SULFATE 2 MG/ML IJ SOLN
1.0000 mg | INTRAMUSCULAR | Status: DC | PRN
  Administered 2014-04-22: 1 mg via INTRAVENOUS
  Filled 2014-04-21: qty 1

## 2014-04-21 MED ORDER — SODIUM CHLORIDE 0.9 % IV SOLN
INTRAVENOUS | Status: AC
  Administered 2014-04-21: 19:00:00 via INTRAVENOUS

## 2014-04-21 MED ORDER — IOHEXOL 300 MG/ML  SOLN
25.0000 mL | Freq: Once | INTRAMUSCULAR | Status: AC | PRN
  Administered 2014-04-21: 25 mL via ORAL

## 2014-04-21 NOTE — ED Provider Notes (Signed)
CSN: [id number]     Arrival date & time 04/21/14  1112 History   First MD Initiated Contact with Patient 04/21/14 1128     Chief Complaint  Patient presents with  . Abdominal Pain     (Consider location/radiation/quality/duration/timing/severity/associated sxs/prior Treatment) HPI Ricky Guzman is a 80 y.o. male with history of A. Fib, on Coumadin, hypertension, rectal bleeding, presents to emergency department complaining of abdominal pain. Patient states his abdominal pain started 2 days ago. States is diffuse. He reports any associated nausea or vomiting. He states he has not had a bowel movement in 3 days. He is not passing gas but burping a lot. He went to see his primary care doctor today, had blood work done which showed elevated white blood count, and had an acute abdomen done which showed possible small bowel obstruction. He was sent here for further evaluation and CT scan. Patient denies any prior history of small bowel obstructions, no prior abdominal surgeries. States his abdomen is more swollen than normal. Denies any fever, chills. States he has good appetite and has been eating.  Past Medical History  Diagnosis Date  . Hypertension   . Arrhythmia   . Atrial fibrillation   . Rectal bleeding 06/17/2012   Past Surgical History  Procedure Laterality Date  . None    . No past surgeries    . Colonoscopy  06/18/2012    Procedure: COLONOSCOPY;  Surgeon: Florencia Reasonsobert V Buccini, MD;  Location: [facility] ENDOSCOPY;  Service: Endoscopy;  Laterality: N/A;   Family History  Problem Relation Age of Onset  . Diabetes Brother    History  Substance Use Topics  . Smoking status: Never Smoker   . Smokeless tobacco: Never Used  . Alcohol Use: Yes     Comment: rare    Review of Systems  Constitutional: Negative for fever and chills.  Respiratory: Negative for cough, chest tightness and shortness of breath.   Cardiovascular: Negative for chest pain, palpitations and leg swelling.   Gastrointestinal: Positive for abdominal pain, constipation and abdominal distention. Negative for nausea, vomiting, diarrhea and blood in stool.  Genitourinary: Negative for dysuria, urgency, frequency and hematuria.  Musculoskeletal: Negative for myalgias, arthralgias, neck pain and neck stiffness.  Skin: Negative for rash.  Neurological: Negative for dizziness, weakness, light-headedness, numbness and headaches.  All other systems reviewed and are negative.     Allergies  Review of patient's allergies indicates no known allergies.  Home Medications   Prior to Admission medications   Medication Sig Start Date End Date Taking? Authorizing Provider  terazosin (HYTRIN) 10 MG capsule Take 10 mg by mouth at bedtime.    Historical Provider, MD  verapamil (CALAN-SR) 240 MG CR tablet Take 240 mg by mouth at bedtime.    Historical Provider, MD   BP 137/103 mmHg  Pulse 54  Temp(Src) 97.9 F (36.6 C) (Oral)  Resp 20  SpO2 100% Physical Exam  Constitutional: He appears well-developed and well-nourished. No distress.  HENT:  Head: Normocephalic and atraumatic.  Eyes: Conjunctivae are normal.  Neck: Neck supple.  Cardiovascular: Normal rate and regular rhythm.   Murmur heard. Pulmonary/Chest: Effort normal. No respiratory distress. He has no wheezes. He has no rales.  Abdominal: Soft. He exhibits distension. There is tenderness. There is no rebound and no guarding.  Hypoactive bowel sounds. Abdomen is distended. Soft umbilical hernia, with mild tenderness. Diffuse mild abdominal tenderness  Musculoskeletal: He exhibits no edema.  Neurological: He is alert.  Skin: Skin is warm and dry.  Nursing note and vitals reviewed.   ED Course  Procedures (including critical care time) Labs Review Labs Reviewed  CBC WITH DIFFERENTIAL - Abnormal; Notable for the following:    Neutrophils Relative % 83 (*)    Lymphocytes Relative 6 (*)    Lymphs Abs 0.5 (*)    All other components within  normal limits  COMPREHENSIVE METABOLIC PANEL - Abnormal; Notable for the following:    Potassium 3.4 (*)    Glucose, Bld 142 (*)    Total Bilirubin 1.8 (*)    GFR calc non Af Amer 62 (*)    GFR calc Af Amer 71 (*)    Anion gap 18 (*)    All other components within normal limits  PROTIME-INR - Abnormal; Notable for the following:    Prothrombin Time 16.2 (*)    All other components within normal limits  I-STAT CG4 LACTIC ACID, ED - Abnormal; Notable for the following:    Lactic Acid, Venous 2.86 (*)    All other components within normal limits  LIPASE, BLOOD  I-STAT TROPOININ, ED    Imaging Review Ct Abdomen Pelvis W Contrast  04/21/2014   CLINICAL DATA:  80 year old male with abdominal pain for the past 2 days accompanied by belching.  EXAM: CT ABDOMEN AND PELVIS WITH CONTRAST  TECHNIQUE: Multidetector CT imaging of the abdomen and pelvis was performed using the standard protocol following bolus administration of intravenous contrast.  CONTRAST:  OMNIPAQUE IOHEXOL 300 MG/ML  SOLN  COMPARISON:  Acute abdominal series radiographs obtained earlier today  FINDINGS: Lower Chest: Trace right pleural effusion. Mild dependent atelectasis bilaterally. Cardiomegaly with biatrial enlargement. No pericardial effusion. Mild circumferential thickening of the visualized distal esophagus. This is nonspecific in appearance. No focal mass. There is a small hiatal hernia.  Abdomen: The stomach is distended and dilated. There is marked distention of the proximal small bowel with a focal transition point in the anterior mid abdomen just posterior to the abdominal fat containing umbilical hernia.  Unremarkable CT appearance of the spleen, pancreas, adrenal glands. Normal hepatic contour and morphology. Unremarkable CT appearance of the spleen, adrenal glands and pancreas. Normal hepatic contour and morphology. Circumscribed hypo attenuating lesions in the left hepatic lobe are most suggestive of benign cysts.  The smaller lesions are too small for accurate characterization. Gallbladder is unremarkable. No intra or extrahepatic biliary ductal dilatation.  Unremarkable appearance of the bilateral kidneys. No focal solid lesion, hydronephrosis or nephrolithiasis. 2.3 cm simple cyst in the interpolar left kidney. 7.3 cm simple cyst exophytic from the lower pole of the left kidney. Additional tiny (less than 1 cm) low-attenuation lesions bilaterally. While too small for accurate characterization, these are also statistically likely simple cysts. No definite enhancing lesion.  Extensive colonic diverticulosis without evidence of active inflammation. Nonspecific mild circumferential thickening of the terminal ileum. There is a small amounts of mesenteric edema and interloop fluid within the mesentery the affected loops of small bowel. No evidence of free air. Trace free fluid in the left pericolic gutter.  Small right inguinal hernia containing omental fat and a small volume of ascites.  Pelvis: Unremarkable bladder, seminal vesicles and prostate gland.  Bones/Soft Tissues: No acute fracture or aggressive appearing lytic or blastic osseous lesion. Multilevel degenerative disc disease throughout the lumbar spine. Prominent partially calcified disc protrusion at L4-L5 results in narrowing of the central canal.  Vascular: Minimal fusiform ectasia of the infrarenal abdominal aorta which measures only 2.3 cm. Scattered atherosclerotic vascular calcifications without aneurysmal  dilatation or focal abnormality.  IMPRESSION: 1. High-grade small bowel obstruction with focal transition point posterior to the omental fat containing umbilical hernia. No definite obstructing mass is identified. Suspected etiology of internal adhesions. If there is no history of prior surgical intervention, internal hernia would also be a consideration. Mild edema and interloop fluid in the affected small bowel mesenteric. No evidence of bowel wall ischemia  or perforation. 2. Nonspecific circumferential thickening of the terminal ileum with perhaps trace surrounding inflammatory stranding. Differential considerations include both infectious and inflammatory enteritis including Crohn's disease. If the patient does have a clinical history of Crohn's disease, the bowel obstruction could also be secondary to a region of focal inflammatory stenosis/stricture. 3. Extensive colonic diverticulosis without evidence of active diverticulitis. 4. Circumferential thickening of the distal thoracic esophagus. Recommend clinical correlation for signs and symptoms of gastroesophageal reflux disease. 5. Cardiomegaly with biatrial enlargement. 6. Trace right pleural effusion. 7. Hepatic and renal cysts. 8. Small right inguinal hernia containing omental fat and small volume ascites. 9. Mild atherosclerotic vascular calcification. 10. Multilevel degenerative disc disease.   Electronically Signed   By: Malachy MoanHeath  McCullough M.D.   On: 04/21/2014 14:02   Dg Abd 2 Views  04/21/2014   CLINICAL DATA:  Abdominal pain and bloating  EXAM: ABDOMEN - 2 VIEW  COMPARISON:  None.  FINDINGS: Supine and erect views of the abdomen show markedly distend loops of primarily small bowel measuring up to 5.6 cm in diameter consistent with high-grade small bowel obstruction. Very little colonic bowel gas is seen. No definite free air is noted on the erect view. CT of the abdomen pelvis may be helpful to determine point of obstruction. No opaque calculi are noted.  IMPRESSION: High-grade small bowel obstruction. Recommend CT of the abdomen pelvis to assess for point of obstruction. No definite free air.   Electronically Signed   By: Dwyane DeePaul  Barry M.D.   On: 04/21/2014 10:59     EKG Interpretation   Date/Time:  Tuesday April 21 2014 12:16:14 EST Ventricular Rate:  94 PR Interval:    QRS Duration: 88 QT Interval:  385 QTC Calculation: 481 R Axis:   57 Text Interpretation:  Atrial fibrillation  Ventricular premature complex ST  depression inferior leads without change. Confirmed by Fayrene FearingJAMES  MD, MARK  [phone]([id number]) on 04/21/2014 3:46:35 PM      MDM   Final diagnoses:  SBO (small bowel obstruction)  Chronic atrial fibrillation    Patient is here with possible small bowel obstruction by x-ray. Will get CT scan of lab work. Will start IV fluids. Patient does not want any pain medications or antibiotics at this time  2:35 PM CT showing high grade SBO. Discussed with Dr. Corliss Skainssuei, will see him. Asked for medicine to admit to manage medial problems and get cardiology clearance. Pt is currently in afib, chronic. INR subtheraputic.  [facility] with [facility], they will admit pt.   Filed Vitals:   04/21/14 1145 04/21/14 1200 04/21/14 1230 04/21/14 1345  BP: 152/92 160/88 147/90 152/90  Pulse:  113 102   Temp:      TempSrc:      Resp:   21   SpO2: 97% 98% 96% 97%     Lottie Musselatyana A Amandalynn Pitz, PA-C 04/21/14 1617  Rolland PorterMark James, MD 04/29/14 1334

## 2014-04-21 NOTE — Progress Notes (Signed)
Report received from Anna ,RN for admission to 5W03 

## 2014-04-21 NOTE — ED Notes (Signed)
Lactic acid results given to Kirichenko, PA-C 

## 2014-04-21 NOTE — H&P (Signed)
History and Physical  Ricky Guzman ZOX:[id number] DOB: September 05, 1930 DOA: 04/21/2014  Referring physician: Lottie Mussel, ER PA PCP: Georgann Housekeeper, MD   Chief Complaint: belly pain  HPI: Ricky Guzman is a 80 y.o. male  With past medical history of atrial fibrillation on chronic Coumadin and hypertension plus umbilical hernia who started having severe abdominal pain starting 2 days ago. Patient attributed this to eating some greasy food the day before. Sent over by his PCP to the emergency room and a CT scan done noted a high-grade small bowel obstruction with focal transition point posterior to the omental fat containing umbilical hernia but no definitive obstructing mass identified. No evidence of bowel wall ischemia or perforation was noted, however lactic acid level was done noted to mildly elevated at 2.8. Patient incidentally also noted to have some nonspecific circumferential thickening of the terminal ileum suggestive of enteritis as well as some circumferential thickening of the distal thoracic esophagus possibly GERD. Surgery consulted to see patient an NG tube placed. Plan for follow-up x-ray in the morning. Patient started on IV fluids. Hospitalists asked to evaluate and admit patient due to medical issues.   Review of Systems:  Patient seen after arrival to floor. Pt complains of abdominal distention, hungry and not being allowed to eat.  Pt denies any headaches, vision changes, dysphagia, chest pain, palpitations, shortness of breath, wheeze, cough no current abdominal pain, no hematuria, dysuria, constipation, diarrhea, focal extremity numbness or weakness or pain.  Review of systems are otherwise negative  Past Medical History  Diagnosis Date  . Hypertension   . Arrhythmia   . Atrial fibrillation   . Rectal bleeding 06/17/2012   Past Surgical History  Procedure Laterality Date  . None    . No past surgeries    . Colonoscopy  06/18/2012    Procedure: COLONOSCOPY;   Surgeon: Florencia Reasons, MD;  Location: [facility] ENDOSCOPY;  Service: Endoscopy;  Laterality: N/A;   Social History:  reports that he has never smoked. He has never used smokeless tobacco. He reports that he drinks alcohol. He reports that he does not use illicit drugs. Patient lives at home by himself & is able to participate in activities of daily living without assistance  No Known Allergies  Family History  Problem Relation Age of Onset  . Diabetes Brother     As discussed with family  Prior to Admission medications   Medication Sig Start Date End Date Taking? Authorizing Provider  benazepril (LOTENSIN) 20 MG tablet Take 20 mg by mouth daily.   Yes Historical Provider, MD  cholecalciferol (VITAMIN D) 1000 UNITS tablet Take 1,000 Units by mouth daily.   Yes Historical Provider, MD  furosemide (LASIX) 20 MG tablet Take 20 mg by mouth 2 (two) times daily.  04/02/14  Yes Historical Provider, MD  LUMIGAN 0.01 % SOLN Place 1 drop into both eyes at bedtime.  02/10/14  Yes Historical Provider, MD  terazosin (HYTRIN) 10 MG capsule Take 10 mg by mouth at bedtime.   Yes Historical Provider, MD  verapamil (CALAN-SR) 240 MG CR tablet Take 240 mg by mouth 2 (two) times daily.    Yes Historical Provider, MD  warfarin (COUMADIN) 5 MG tablet Take 2.5-5 mg by mouth daily. 2.5 mg on tue, thu and 5 mg all other days 03/05/14  Yes Historical Provider, MD    Physical Exam: BP 155/84 mmHg  Pulse 98  Temp(Src) 98.2 F (36.8 C) (Oral)  Resp 20  Ht 5\' 10"  (1.778 m)  Wt 94.62 kg (208 lb 9.6 oz)  BMI 29.93 kg/m2  SpO2 98%  General:  Alert and oriented 3, mild distress secondary to NG tube Eyes: sclerae nonicteric, X or ocular movements are intact ENT: normocephalic, atraumatic, mucous members are dry, NG tube in place Neck: no JVD Cardiovascular: irregular rhythm, rate controlled Respiratory: clear to auscultation bilaterally Abdomen: soft, significantly distended, firm, absent bowel sounds Skin: no skin  breaks, tears or lesions Musculoskeletal: no clubbing or cyanosis or edema Psychiatric: patient is appropriate, no evidence of psychoses Neurologic: no focal deficits          Labs on Admission:  Basic Metabolic Panel:  Recent Labs Lab 04/21/14 1148  NA 139  K 3.4*  CL 96  CO2 25  GLUCOSE 142*  BUN 19  CREATININE 1.09  CALCIUM 9.8   Liver Function Tests:  Recent Labs Lab 04/21/14 1148  AST 17  ALT 10  ALKPHOS 59  BILITOT 1.8*  PROT 8.1  ALBUMIN 3.7    Recent Labs Lab 04/21/14 1148  LIPASE 11   No results for input(s): AMMONIA in the last 168 hours. CBC:  Recent Labs Lab 04/21/14 1148  WBC 9.1  NEUTROABS 7.6  HGB 14.9  HCT 43.6  MCV 86.0  PLT 195   Cardiac Enzymes: No results for input(s): CKTOTAL, CKMB, CKMBINDEX, TROPONINI in the last 168 hours.  BNP (last 3 results) No results for input(s): PROBNP in the last 8760 hours. CBG: No results for input(s): GLUCAP in the last 168 hours.  Radiological Exams on Admission: Ct Abdomen Pelvis W Contrast  04/21/2014   CLINICAL DATA:  80 year old male with abdominal pain for the past 2 days accompanied by belching.  EXAM: CT ABDOMEN AND PELVIS WITH CONTRAST  TECHNIQUE: Multidetector CT imaging of the abdomen and pelvis was performed using the standard protocol following bolus administration of intravenous contrast.  CONTRAST:  100mL OMNIPAQUE IOHEXOL 300 MG/ML  SOLN  COMPARISON:  Acute abdominal series radiographs obtained earlier today  FINDINGS: Lower Chest: Trace right pleural effusion. Mild dependent atelectasis bilaterally. Cardiomegaly with biatrial enlargement. No pericardial effusion. Mild circumferential thickening of the visualized distal esophagus. This is nonspecific in appearance. No focal mass. There is a small hiatal hernia.  Abdomen: The stomach is distended and dilated. There is marked distention of the proximal small bowel with a focal transition point in the anterior mid abdomen just posterior to  the abdominal fat containing umbilical hernia.  Unremarkable CT appearance of the spleen, pancreas, adrenal glands. Normal hepatic contour and morphology. Unremarkable CT appearance of the spleen, adrenal glands and pancreas. Normal hepatic contour and morphology. Circumscribed hypo attenuating lesions in the left hepatic lobe are most suggestive of benign cysts. The smaller lesions are too small for accurate characterization. Gallbladder is unremarkable. No intra or extrahepatic biliary ductal dilatation.  Unremarkable appearance of the bilateral kidneys. No focal solid lesion, hydronephrosis or nephrolithiasis. 2.3 cm simple cyst in the interpolar left kidney. 7.3 cm simple cyst exophytic from the lower pole of the left kidney. Additional tiny (less than 1 cm) low-attenuation lesions bilaterally. While too small for accurate characterization, these are also statistically likely simple cysts. No definite enhancing lesion.  Extensive colonic diverticulosis without evidence of active inflammation. Nonspecific mild circumferential thickening of the terminal ileum. There is a small amounts of mesenteric edema and interloop fluid within the mesentery the affected loops of small bowel. No evidence of free air. Trace free fluid in the left pericolic gutter.  Small right inguinal hernia  containing omental fat and a small volume of ascites.  Pelvis: Unremarkable bladder, seminal vesicles and prostate gland.  Bones/Soft Tissues: No acute fracture or aggressive appearing lytic or blastic osseous lesion. Multilevel degenerative disc disease throughout the lumbar spine. Prominent partially calcified disc protrusion at L4-L5 results in narrowing of the central canal.  Vascular: Minimal fusiform ectasia of the infrarenal abdominal aorta which measures only 2.3 cm. Scattered atherosclerotic vascular calcifications without aneurysmal dilatation or focal abnormality.  IMPRESSION: 1. High-grade small bowel obstruction with focal  transition point posterior to the omental fat containing umbilical hernia. No definite obstructing mass is identified. Suspected etiology of internal adhesions. If there is no history of prior surgical intervention, internal hernia would also be a consideration. Mild edema and interloop fluid in the affected small bowel mesenteric. No evidence of bowel wall ischemia or perforation. 2. Nonspecific circumferential thickening of the terminal ileum with perhaps trace surrounding inflammatory stranding. Differential considerations include both infectious and inflammatory enteritis including Crohn's disease. If the patient does have a clinical history of Crohn's disease, the bowel obstruction could also be secondary to a region of focal inflammatory stenosis/stricture. 3. Extensive colonic diverticulosis without evidence of active diverticulitis. 4. Circumferential thickening of the distal thoracic esophagus. Recommend clinical correlation for signs and symptoms of gastroesophageal reflux disease. 5. Cardiomegaly with biatrial enlargement. 6. Trace right pleural effusion. 7. Hepatic and renal cysts. 8. Small right inguinal hernia containing omental fat and small volume ascites. 9. Mild atherosclerotic vascular calcification. 10. Multilevel degenerative disc disease.   Electronically Signed   By: Malachy Moan M.D.   On: 04/21/2014 14:02   Dg Abd 2 Views  04/21/2014   CLINICAL DATA:  Abdominal pain and bloating  EXAM: ABDOMEN - 2 VIEW  COMPARISON:  None.  FINDINGS: Supine and erect views of the abdomen show markedly distend loops of primarily small bowel measuring up to 5.6 cm in diameter consistent with high-grade small bowel obstruction. Very little colonic bowel gas is seen. No definite free air is noted on the erect view. CT of the abdomen pelvis may be helpful to determine point of obstruction. No opaque calculi are noted.  IMPRESSION: High-grade small bowel obstruction. Recommend CT of the abdomen pelvis to  assess for point of obstruction. No definite free air.   Electronically Signed   By: Dwyane Dee M.D.   On: 04/21/2014 10:59   Dg Abd Portable 1v  04/21/2014   CLINICAL DATA:  Nasogastric tube placement  EXAM: PORTABLE ABDOMEN - 1 VIEW  COMPARISON:  Portable exam 1451 hr compared to 04/21/2014 at 1009 hr  FINDINGS: Tip of nasogastric tube projects over distal esophagus ; recommend advancing tube 10 cm to place proximal side-port within proximal stomach.  Persistent small bowel dilatation.  Bibasilar atelectasis.  Osseous demineralization.  IMPRESSION: Tip of nasogastric tube projects over distal esophagus, recommend advancing tube 10 cm.   Electronically Signed   By: Ulyses Southward M.D.   On: 04/21/2014 15:29    EKG: Independently reviewed. Atrial fibrillation, rate controlled  Assessment/Plan Present on Admission:  . SBO (small bowel obstruction): High-grade. Stable for now with NG tube. Surgery following. May need surgery, sooner than later. Repeat x-rays in the morning plan, continue nothing by mouth. Patient has made some statements about not wanting to have surgery done matter what, however he desires to be a full code and does not seem to fully grasp the situation. Head repeatedly explain what was going on along with the NG tube. His daughter who  is present seems to understand and therefore, I'm much of the patient truly appreciates the gravity of the situation  . Essential hypertension: Holding oral antihypertensives, control with when necessary IV Lopressor  . Obesity (BMI 30-39.9):patient meets criteria with BMI greater than 30  . Glaucoma: Continue drops.  . Atrial fibrillation, chronic: Rate control with IV Lopressor. Holding anticoagulation with possibility surgery.  . Lactic acid acidosis: secondary to high-grade obstruction. Repeating labs tomorrow  Consultants: general surgery  Code Status: full code as confirmed by patient and daughter  Family Communication: daughter at the  bedside   Disposition Plan: here for several days until small bowel obstruction resolves  Time spent: 35 minutes  Hollice EspyKRISHNAN,Reshunda Strider K [facility] Pager [phone]

## 2014-04-21 NOTE — Consult Note (Signed)
Reason for Consult:  SBO Referring Physician: Lianne Bushy PAC Pcp:  Wenda Low, MD  Ricky Guzman is an 80 y.o. male.  HPI: Pt says he started feeling bad on Sunday 04/19/14, after lunch.  He didn't really complain of pain, but he had hiccups, and his memory is a little different than his daughters.  Monday he felt better during the day, ate breakfast and lunch, but started having more discomfort and hiccups again.  Pain persisted all during the night yesterday and this Am so he called and came to the ED.  He has had some nausea today, but none at home.  He says his stomach is always this big and the umbilical hernia does not seem to be abnormal to him.  In the ED his BO and HR is up some.  Labs show a mildly depressed K+.  WBC and troponin is normal.  Lactic acid is 2.86. T bil 1.8.  CT scan shows a high grade obstruction with focal transition point behind some omental fat going into an umbilical hernia.  Nonspecific circumferential thickening of the terminal ileum with perhaps trace surrounding inflammatory stranding. Extensive colonic diverticulosis. RIH with omental fat/ascites. Hepatic and renal cyst.  We are ask to see.  NG is in and he has drained almost 700 ml so far.     Past Medical History  Diagnosis Date  . Hypertension   . Arrhythmia   . Atrial fibrillation, on coumadin  Checked last week by PCP   . Rectal bleeding 06/17/2012 Hospitalization/transfused.    Past Surgical History  Procedure Laterality Date  . None    . No past surgeries    . Colonoscopy  06/18/2012    Procedure: COLONOSCOPY;  Surgeon: Cleotis Nipper, MD;  Location: [facility] ENDOSCOPY;  Service: Endoscopy;  Laterality: N/A;    Family History  Problem Relation Age of Onset  . Diabetes Brother     Social History:  reports that he has never smoked. He has never used smokeless tobacco. He reports that he drinks alcohol. He reports that he does not use illicit drugs. Tobacco: never Etoh;  Negative Drugs:   None Retired Merchandiser, retail Allergies: No Known Allergies  Prior to Admission medications   Medication Sig Start Date End Date Taking? Authorizing Provider  benazepril (LOTENSIN) 20 MG tablet Take 20 mg by mouth daily.   Yes Historical Provider, MD  cholecalciferol (VITAMIN D) 1000 UNITS tablet Take 1,000 Units by mouth daily.   Yes Historical Provider, MD  furosemide (LASIX) 20 MG tablet Take 20 mg by mouth 2 (two) times daily.  04/02/14  Yes Historical Provider, MD  LUMIGAN 0.01 % SOLN Place 1 drop into both eyes at bedtime.  02/10/14  Yes Historical Provider, MD  terazosin (HYTRIN) 10 MG capsule Take 10 mg by mouth at bedtime.   Yes Historical Provider, MD  verapamil (CALAN-SR) 240 MG CR tablet Take 240 mg by mouth 2 (two) times daily.    Yes Historical Provider, MD  warfarin (COUMADIN) 5 MG tablet Take 2.5-5 mg by mouth daily. 2.5 mg on tue, thu and 5 mg all other days 03/05/14  Yes Historical Provider, MD     Anti-infectives    None      Results for orders placed or performed during the hospital encounter of 04/21/14 (from the past 48 hour(s))  CBC with Differential     Status: Abnormal   Collection Time: 04/21/14 11:48 AM  Result Value Ref Range   WBC 9.1 4.0 -  10.5 K/uL   RBC 5.07 4.22 - 5.81 MIL/uL   Hemoglobin 14.9 13.0 - 17.0 g/dL   HCT 43.6 39.0 - 52.0 %   MCV 86.0 78.0 - 100.0 fL   MCH 29.4 26.0 - 34.0 pg   MCHC 34.2 30.0 - 36.0 g/dL   RDW 13.6 11.5 - 15.5 %   Platelets 195 150 - 400 K/uL   Neutrophils Relative % 83 (H) 43 - 77 %   Neutro Abs 7.6 1.7 - 7.7 K/uL   Lymphocytes Relative 6 (L) 12 - 46 %   Lymphs Abs 0.5 (L) 0.7 - 4.0 K/uL   Monocytes Relative 11 3 - 12 %   Monocytes Absolute 1.0 0.1 - 1.0 K/uL   Eosinophils Relative 0 0 - 5 %   Eosinophils Absolute 0.0 0.0 - 0.7 K/uL   Basophils Relative 0 0 - 1 %   Basophils Absolute 0.0 0.0 - 0.1 K/uL  Comprehensive metabolic panel     Status: Abnormal   Collection Time: 04/21/14 11:48 AM  Result  Value Ref Range   Sodium 139 137 - 147 mEq/L   Potassium 3.4 (L) 3.7 - 5.3 mEq/L   Chloride 96 96 - 112 mEq/L   CO2 25 19 - 32 mEq/L   Glucose, Bld 142 (H) 70 - 99 mg/dL   BUN 19 6 - 23 mg/dL   Creatinine, Ser 1.09 0.50 - 1.35 mg/dL   Calcium 9.8 8.4 - 10.5 mg/dL   Total Protein 8.1 6.0 - 8.3 g/dL   Albumin 3.7 3.5 - 5.2 g/dL   AST 17 0 - 37 U/L   ALT 10 0 - 53 U/L   Alkaline Phosphatase 59 39 - 117 U/L   Total Bilirubin 1.8 (H) 0.3 - 1.2 mg/dL   GFR calc non Af Amer 62 (L) >90 mL/min   GFR calc Af Amer 71 (L) >90 mL/min    Comment: (NOTE) The eGFR has been calculated using the CKD EPI equation. This calculation has not been validated in all clinical situations. eGFR's persistently <90 mL/min signify possible Chronic Kidney Disease.    Anion gap 18 (H) 5 - 15  Lipase, blood     Status: None   Collection Time: 04/21/14 11:48 AM  Result Value Ref Range   Lipase 11 11 - 59 U/L  Protime-INR     Status: Abnormal   Collection Time: 04/21/14 11:48 AM  Result Value Ref Range   Prothrombin Time 16.2 (H) 11.6 - 15.2 seconds   INR 1.29 0.00 - 1.49  I-Stat Troponin, ED (not at [facility])     Status: None   Collection Time: 04/21/14 11:55 AM  Result Value Ref Range   Troponin i, poc 0.01 0.00 - 0.08 ng/mL   Comment 3            Comment: Due to the release kinetics of cTnI, a negative result within the first hours of the onset of symptoms does not rule out myocardial infarction with certainty. If myocardial infarction is still suspected, repeat the test at appropriate intervals.   I-Stat CG4 Lactic Acid, ED     Status: Abnormal   Collection Time: 04/21/14 11:57 AM  Result Value Ref Range   Lactic Acid, Venous 2.86 (H) 0.5 - 2.2 mmol/L    Ct Abdomen Pelvis W Contrast  04/21/2014   CLINICAL DATA:  80 year old male with abdominal pain for the past 2 days accompanied by belching.  EXAM: CT ABDOMEN AND PELVIS WITH CONTRAST  TECHNIQUE: Multidetector CT  imaging of the abdomen and pelvis was  performed using the standard protocol following bolus administration of intravenous contrast.  CONTRAST:  122mL OMNIPAQUE IOHEXOL 300 MG/ML  SOLN  COMPARISON:  Acute abdominal series radiographs obtained earlier today  FINDINGS: Lower Chest: Trace right pleural effusion. Mild dependent atelectasis bilaterally. Cardiomegaly with biatrial enlargement. No pericardial effusion. Mild circumferential thickening of the visualized distal esophagus. This is nonspecific in appearance. No focal mass. There is a small hiatal hernia.  Abdomen: The stomach is distended and dilated. There is marked distention of the proximal small bowel with a focal transition point in the anterior mid abdomen just posterior to the abdominal fat containing umbilical hernia.  Unremarkable CT appearance of the spleen, pancreas, adrenal glands. Normal hepatic contour and morphology. Unremarkable CT appearance of the spleen, adrenal glands and pancreas. Normal hepatic contour and morphology. Circumscribed hypo attenuating lesions in the left hepatic lobe are most suggestive of benign cysts. The smaller lesions are too small for accurate characterization. Gallbladder is unremarkable. No intra or extrahepatic biliary ductal dilatation.  Unremarkable appearance of the bilateral kidneys. No focal solid lesion, hydronephrosis or nephrolithiasis. 2.3 cm simple cyst in the interpolar left kidney. 7.3 cm simple cyst exophytic from the lower pole of the left kidney. Additional tiny (less than 1 cm) low-attenuation lesions bilaterally. While too small for accurate characterization, these are also statistically likely simple cysts. No definite enhancing lesion.  Extensive colonic diverticulosis without evidence of active inflammation. Nonspecific mild circumferential thickening of the terminal ileum. There is a small amounts of mesenteric edema and interloop fluid within the mesentery the affected loops of small bowel. No evidence of free air. Trace free fluid  in the left pericolic gutter.  Small right inguinal hernia containing omental fat and a small volume of ascites.  Pelvis: Unremarkable bladder, seminal vesicles and prostate gland.  Bones/Soft Tissues: No acute fracture or aggressive appearing lytic or blastic osseous lesion. Multilevel degenerative disc disease throughout the lumbar spine. Prominent partially calcified disc protrusion at L4-L5 results in narrowing of the central canal.  Vascular: Minimal fusiform ectasia of the infrarenal abdominal aorta which measures only 2.3 cm. Scattered atherosclerotic vascular calcifications without aneurysmal dilatation or focal abnormality.  IMPRESSION: 1. High-grade small bowel obstruction with focal transition point posterior to the omental fat containing umbilical hernia. No definite obstructing mass is identified. Suspected etiology of internal adhesions. If there is no history of prior surgical intervention, internal hernia would also be a consideration. Mild edema and interloop fluid in the affected small bowel mesenteric. No evidence of bowel wall ischemia or perforation. 2. Nonspecific circumferential thickening of the terminal ileum with perhaps trace surrounding inflammatory stranding. Differential considerations include both infectious and inflammatory enteritis including Crohn's disease. If the patient does have a clinical history of Crohn's disease, the bowel obstruction could also be secondary to a region of focal inflammatory stenosis/stricture. 3. Extensive colonic diverticulosis without evidence of active diverticulitis. 4. Circumferential thickening of the distal thoracic esophagus. Recommend clinical correlation for signs and symptoms of gastroesophageal reflux disease. 5. Cardiomegaly with biatrial enlargement. 6. Trace right pleural effusion. 7. Hepatic and renal cysts. 8. Small right inguinal hernia containing omental fat and small volume ascites. 9. Mild atherosclerotic vascular calcification. 10.  Multilevel degenerative disc disease.   Electronically Signed   By: Jacqulynn Cadet M.D.   On: 04/21/2014 14:02   Dg Abd 2 Views  04/21/2014   CLINICAL DATA:  Abdominal pain and bloating  EXAM: ABDOMEN - 2 VIEW  COMPARISON:  None.  FINDINGS: Supine and erect views of the abdomen show markedly distend loops of primarily small bowel measuring up to 5.6 cm in diameter consistent with high-grade small bowel obstruction. Very little colonic bowel gas is seen. No definite free air is noted on the erect view. CT of the abdomen pelvis may be helpful to determine point of obstruction. No opaque calculi are noted.  IMPRESSION: High-grade small bowel obstruction. Recommend CT of the abdomen pelvis to assess for point of obstruction. No definite free air.   Electronically Signed   By: Ivar Drape M.D.   On: 04/21/2014 10:59    Review of Systems  Constitutional: Negative.   HENT: Negative.   Eyes: Negative.   Respiratory: Negative.   Cardiovascular: Positive for leg swelling (some , he takes fluid pill for this.).  Gastrointestinal: Positive for nausea (minimal). Negative for diarrhea, constipation, blood in stool and melena. Abdominal pain: over the abdomen, no one particular place.       Hiccups Last BM 04/19/14.  Genitourinary: Negative.   Musculoskeletal: Positive for joint pain.  Skin: Negative.   Neurological: Negative.   Endo/Heme/Allergies: Negative.   Psychiatric/Behavioral: Negative.    Blood pressure 148/84, pulse 54, temperature 97.9 F (36.6 C), temperature source Oral, resp. rate 25, SpO2 96 %. Physical Exam  Constitutional: He is oriented to person, place, and time. He appears well-developed.  Obese male with marked abdominal distension,  NG in place.  Over 500 in NG.  HENT:  Head: Normocephalic and atraumatic.  Nose: Nose normal.  NG in place and draining well.  Eyes: Conjunctivae and EOM are normal. Right eye exhibits no discharge. Left eye exhibits no discharge.  Bilateral  arcus senilis  Neck: Normal range of motion. Neck supple. No JVD present. No tracheal deviation present. No thyromegaly present.  Cardiovascular: Normal heart sounds.   No murmur heard. AF HR in 100's,   Respiratory: Effort normal. No respiratory distress. He has no wheezes. He has rales (few in bases). He exhibits no tenderness.  GI: He exhibits distension. He exhibits no mass. There is no tenderness. There is no rebound and no guarding.  Markedly distended, with hyper active BS 4x3 cm umbilical hernia.  He says this is not really different than normal.  Musculoskeletal: He exhibits edema (trace both legs). He exhibits no tenderness.  Lymphadenopathy:    He has no cervical adenopathy.  Neurological: He is alert and oriented to person, place, and time. No cranial nerve deficit.  Skin: Skin is warm and dry. No rash noted. No erythema. No pallor.  Psychiatric: He has a normal mood and affect. His behavior is normal. Judgment and thought content normal.    Assessment/Plan: 1.  SBO with umbilical hernia 2.  RIH hernia 3.  Extensive colonic diverticulosis with rectal bleed last year 4.  Hepatic and real cyst 6.  Hypertension 7.   Afib on coumadin 8.  Obesity   Plan:  Agree with NG placement, bowel rest, hydration, hold his coumadin.  When he is better and medically cleared we will discuss repair of the umbilical hernia. If you need to use hepain to bridge we can stop before surgery.  His NG drainage is a little red, will add PPI.  Film and labs in AM.  Earnstine Regal 04/21/2014, 3:22 PM

## 2014-04-21 NOTE — ED Notes (Signed)
Pt belongings sent with patient. Pt is upset about having the NG tube. Pt explained the purpose of the NG tube.

## 2014-04-21 NOTE — ED Notes (Signed)
NG tube advanced per MD Marlyne BeardsJennings request, thick brown liquid being suctioned. 1200ml emptied.

## 2014-04-21 NOTE — ED Notes (Addendum)
Abd. Pain since Sunday. No n/v/d. Good appetite. Much belching. Pt. Had an abd. [facility] and [facility] work at [facility] and dx. With bowel obstruction. Last bm on 11/15.

## 2014-04-21 NOTE — ED Notes (Signed)
Pt returned from CT °

## 2014-04-21 NOTE — Consult Note (Signed)
  Patient briefly examined - two days of bloating, nausea but no vomiting, no bowel movements or flatus.  Long-standing umbilical hernia.  Abdomen is distended, but no localized tenderness.  Hernia seems to be soft and contains only fat.  However, my examination of the CT scan shows that the mesenteric fat of the small bowel may be pulled up into the small hernia that could be causing the bowel obstruction.  Recommend NG tube, bowel rest, IV hydration Cardiac clearance for possible general anesthesia, exploratory laparotomy, hernia repair.   Will recheck films in AM to see if there has been any improvement in his bowel function.  Wilmon ArmsMatthew K. Corliss Skainssuei, MD, [facility]  General/ Trauma Surgery  04/21/2014 2:43 PM

## 2014-04-22 ENCOUNTER — Inpatient Hospital Stay (HOSPITAL_COMMUNITY): Payer: Medicare Other

## 2014-04-22 DIAGNOSIS — E876 Hypokalemia: Secondary | ICD-10-CM

## 2014-04-22 DIAGNOSIS — Z01810 Encounter for preprocedural cardiovascular examination: Secondary | ICD-10-CM

## 2014-04-22 DIAGNOSIS — R509 Fever, unspecified: Secondary | ICD-10-CM | POA: Insufficient documentation

## 2014-04-22 LAB — COMPREHENSIVE METABOLIC PANEL WITH GFR
ALT: 8 U/L (ref 0–53)
AST: 13 U/L (ref 0–37)
Albumin: 3 g/dL — ABNORMAL LOW (ref 3.5–5.2)
Alkaline Phosphatase: 42 U/L (ref 39–117)
Anion gap: 14 (ref 5–15)
BUN: 20 mg/dL (ref 6–23)
CO2: 25 meq/L (ref 19–32)
Calcium: 8.7 mg/dL (ref 8.4–10.5)
Chloride: 103 meq/L (ref 96–112)
Creatinine, Ser: 0.98 mg/dL (ref 0.50–1.35)
GFR calc Af Amer: 87 mL/min — ABNORMAL LOW
GFR calc non Af Amer: 75 mL/min — ABNORMAL LOW
Glucose, Bld: 137 mg/dL — ABNORMAL HIGH (ref 70–99)
Potassium: 3.5 meq/L — ABNORMAL LOW (ref 3.7–5.3)
Sodium: 142 meq/L (ref 137–147)
Total Bilirubin: 1.4 mg/dL — ABNORMAL HIGH (ref 0.3–1.2)
Total Protein: 6.9 g/dL (ref 6.0–8.3)

## 2014-04-22 LAB — CBC
HCT: 39.2 % (ref 39.0–52.0)
Hemoglobin: 13.2 g/dL (ref 13.0–17.0)
MCH: 28.4 pg (ref 26.0–34.0)
MCHC: 33.7 g/dL (ref 30.0–36.0)
MCV: 84.3 fL (ref 78.0–100.0)
Platelets: 199 10*3/uL (ref 150–400)
RBC: 4.65 MIL/uL (ref 4.22–5.81)
RDW: 13.6 % (ref 11.5–15.5)
WBC: 5.8 10*3/uL (ref 4.0–10.5)

## 2014-04-22 LAB — URINALYSIS, ROUTINE W REFLEX MICROSCOPIC
Glucose, UA: NEGATIVE mg/dL
Ketones, ur: NEGATIVE mg/dL
Leukocytes, UA: NEGATIVE
NITRITE: NEGATIVE
Protein, ur: 100 mg/dL — AB
Specific Gravity, Urine: 1.029 (ref 1.005–1.030)
UROBILINOGEN UA: 1 mg/dL (ref 0.0–1.0)
pH: 6.5 (ref 5.0–8.0)

## 2014-04-22 LAB — URINE MICROSCOPIC-ADD ON

## 2014-04-22 LAB — LACTIC ACID, PLASMA: LACTIC ACID, VENOUS: 1.2 mmol/L (ref 0.5–2.2)

## 2014-04-22 LAB — TROPONIN I

## 2014-04-22 LAB — PRO B NATRIURETIC PEPTIDE: Pro B Natriuretic peptide (BNP): 3186 pg/mL — ABNORMAL HIGH (ref 0–450)

## 2014-04-22 LAB — GLUCOSE, CAPILLARY
Glucose-Capillary: 125 mg/dL — ABNORMAL HIGH (ref 70–99)
Glucose-Capillary: 126 mg/dL — ABNORMAL HIGH (ref 70–99)

## 2014-04-22 MED ORDER — POTASSIUM CHLORIDE 2 MEQ/ML IV SOLN
INTRAVENOUS | Status: DC
  Filled 2014-04-22: qty 1000

## 2014-04-22 MED ORDER — POTASSIUM CHLORIDE 2 MEQ/ML IV SOLN
INTRAVENOUS | Status: DC
  Administered 2014-04-22 – 2014-04-24 (×5): via INTRAVENOUS
  Filled 2014-04-22 (×5): qty 1000

## 2014-04-22 MED ORDER — HEPARIN (PORCINE) IN NACL 100-0.45 UNIT/ML-% IJ SOLN
1350.0000 [IU]/h | INTRAMUSCULAR | Status: DC
  Administered 2014-04-22: 1150 [IU]/h via INTRAVENOUS
  Administered 2014-04-23: 1450 [IU]/h via INTRAVENOUS
  Administered 2014-04-24: 1350 [IU]/h via INTRAVENOUS
  Filled 2014-04-22 (×4): qty 250

## 2014-04-22 MED ORDER — POTASSIUM CHLORIDE 10 MEQ/100ML IV SOLN
10.0000 meq | INTRAVENOUS | Status: DC
  Administered 2014-04-22 (×2): 10 meq via INTRAVENOUS
  Filled 2014-04-22 (×3): qty 100

## 2014-04-22 NOTE — Plan of Care (Signed)
Problem: Phase II Progression Outcomes Goal: Vital signs remain stable Outcome: Progressing Temperature of 101.2 oral. PRN tylenol given and temperature 99.1.

## 2014-04-22 NOTE — Progress Notes (Signed)
Patient ID: Ricky Guzman, male   DOB: 07/29/1930, 80 y.o.   MRN: [id number]    Subjective: Pt feels better today and is passing some flatus, no BM.  Objective: Vital signs in last 24 hours: Temp:  [97.9 F (36.6 C)-98.5 F (36.9 C)] 98.5 F (36.9 C) (11/18 0537) Pulse Rate:  [48-124] 89 (11/18 0537) Resp:  [18-27] 22 (11/18 0537) BP: (113-160)/(76-103) 141/94 mmHg (11/18 0537) SpO2:  [94 %-100 %] 97 % (11/18 0537) Weight:  [208 lb 9.6 oz (94.62 kg)] 208 lb 9.6 oz (94.62 kg) (11/17 1847) Last BM Date: 04/18/14  Intake/Output from previous day: 11/17 0701 - 11/18 0700 In: 778.8 [I.V.:778.8] Out: 2450 [Urine:375; Emesis/NG output:875] Intake/Output this shift: Total I/O In: 96.3 [I.V.:96.3] Out: -   PE: Abd: somewhat soft, but still distended, much more so in the upper abdomen than lower abdomen.  Umbilical hernia is present and soft.  Do not feel that full contents are reduced. Heart: irregularly, irregular Lungs: CTAB  Lab Results:   Recent Labs  04/21/14 1148 04/22/14 0526  WBC 9.1 5.8  HGB 14.9 13.2  HCT 43.6 39.2  PLT 195 199   BMET  Recent Labs  04/21/14 1148 04/22/14 0526  NA 139 142  K 3.4* 3.5*  CL 96 103  CO2 25 25  GLUCOSE 142* 137*  BUN 19 20  CREATININE 1.09 0.98  CALCIUM 9.8 8.7   PT/INR  Recent Labs  04/21/14 1148  LABPROT 16.2*  INR 1.29   CMP     Component Value Date/Time   NA 142 04/22/2014 0526   K 3.5* 04/22/2014 0526   CL 103 04/22/2014 0526   CO2 25 04/22/2014 0526   GLUCOSE 137* 04/22/2014 0526   BUN 20 04/22/2014 0526   CREATININE 0.98 04/22/2014 0526   CALCIUM 8.7 04/22/2014 0526   PROT 6.9 04/22/2014 0526   ALBUMIN 3.0* 04/22/2014 0526   AST 13 04/22/2014 0526   ALT 8 04/22/2014 0526   ALKPHOS 42 04/22/2014 0526   BILITOT 1.4* 04/22/2014 0526   GFRNONAA 75* 04/22/2014 0526   GFRAA 87* 04/22/2014 0526   Lipase     Component Value Date/Time   LIPASE 11 04/21/2014 1148       Studies/Results: Ct Abdomen  Pelvis W Contrast  04/21/2014   CLINICAL DATA:  80 year old male with abdominal pain for the past 2 days accompanied by belching.  EXAM: CT ABDOMEN AND PELVIS WITH CONTRAST  TECHNIQUE: Multidetector CT imaging of the abdomen and pelvis was performed using the standard protocol following bolus administration of intravenous contrast.  CONTRAST:  100mL OMNIPAQUE IOHEXOL 300 MG/ML  SOLN  COMPARISON:  Acute abdominal series radiographs obtained earlier today  FINDINGS: Lower Chest: Trace right pleural effusion. Mild dependent atelectasis bilaterally. Cardiomegaly with biatrial enlargement. No pericardial effusion. Mild circumferential thickening of the visualized distal esophagus. This is nonspecific in appearance. No focal mass. There is a small hiatal hernia.  Abdomen: The stomach is distended and dilated. There is marked distention of the proximal small bowel with a focal transition point in the anterior mid abdomen just posterior to the abdominal fat containing umbilical hernia.  Unremarkable CT appearance of the spleen, pancreas, adrenal glands. Normal hepatic contour and morphology. Unremarkable CT appearance of the spleen, adrenal glands and pancreas. Normal hepatic contour and morphology. Circumscribed hypo attenuating lesions in the left hepatic lobe are most suggestive of benign cysts. The smaller lesions are too small for accurate characterization. Gallbladder is unremarkable. No intra or extrahepatic biliary ductal  dilatation.  Unremarkable appearance of the bilateral kidneys. No focal solid lesion, hydronephrosis or nephrolithiasis. 2.3 cm simple cyst in the interpolar left kidney. 7.3 cm simple cyst exophytic from the lower pole of the left kidney. Additional tiny (less than 1 cm) low-attenuation lesions bilaterally. While too small for accurate characterization, these are also statistically likely simple cysts. No definite enhancing lesion.  Extensive colonic diverticulosis without evidence of active  inflammation. Nonspecific mild circumferential thickening of the terminal ileum. There is a small amounts of mesenteric edema and interloop fluid within the mesentery the affected loops of small bowel. No evidence of free air. Trace free fluid in the left pericolic gutter.  Small right inguinal hernia containing omental fat and a small volume of ascites.  Pelvis: Unremarkable bladder, seminal vesicles and prostate gland.  Bones/Soft Tissues: No acute fracture or aggressive appearing lytic or blastic osseous lesion. Multilevel degenerative disc disease throughout the lumbar spine. Prominent partially calcified disc protrusion at L4-L5 results in narrowing of the central canal.  Vascular: Minimal fusiform ectasia of the infrarenal abdominal aorta which measures only 2.3 cm. Scattered atherosclerotic vascular calcifications without aneurysmal dilatation or focal abnormality.  IMPRESSION: 1. High-grade small bowel obstruction with focal transition point posterior to the omental fat containing umbilical hernia. No definite obstructing mass is identified. Suspected etiology of internal adhesions. If there is no history of prior surgical intervention, internal hernia would also be a consideration. Mild edema and interloop fluid in the affected small bowel mesenteric. No evidence of bowel wall ischemia or perforation. 2. Nonspecific circumferential thickening of the terminal ileum with perhaps trace surrounding inflammatory stranding. Differential considerations include both infectious and inflammatory enteritis including Crohn's disease. If the patient does have a clinical history of Crohn's disease, the bowel obstruction could also be secondary to a region of focal inflammatory stenosis/stricture. 3. Extensive colonic diverticulosis without evidence of active diverticulitis. 4. Circumferential thickening of the distal thoracic esophagus. Recommend clinical correlation for signs and symptoms of gastroesophageal reflux  disease. 5. Cardiomegaly with biatrial enlargement. 6. Trace right pleural effusion. 7. Hepatic and renal cysts. 8. Small right inguinal hernia containing omental fat and small volume ascites. 9. Mild atherosclerotic vascular calcification. 10. Multilevel degenerative disc disease.   Electronically Signed   By: Malachy MoanHeath  McCullough M.D.   On: 04/21/2014 14:02   Dg Abd 2 Views  04/21/2014   CLINICAL DATA:  Abdominal pain and bloating  EXAM: ABDOMEN - 2 VIEW  COMPARISON:  None.  FINDINGS: Supine and erect views of the abdomen show markedly distend loops of primarily small bowel measuring up to 5.6 cm in diameter consistent with high-grade small bowel obstruction. Very little colonic bowel gas is seen. No definite free air is noted on the erect view. CT of the abdomen pelvis may be helpful to determine point of obstruction. No opaque calculi are noted.  IMPRESSION: High-grade small bowel obstruction. Recommend CT of the abdomen pelvis to assess for point of obstruction. No definite free air.   Electronically Signed   By: Dwyane DeePaul  Barry M.D.   On: 04/21/2014 10:59   Dg Abd Portable 1v  04/21/2014   CLINICAL DATA:  Nasogastric tube placement  EXAM: PORTABLE ABDOMEN - 1 VIEW  COMPARISON:  Portable exam 1451 hr compared to 04/21/2014 at 1009 hr  FINDINGS: Tip of nasogastric tube projects over distal esophagus ; recommend advancing tube 10 cm to place proximal side-port within proximal stomach.  Persistent small bowel dilatation.  Bibasilar atelectasis.  Osseous demineralization.  IMPRESSION: Tip of nasogastric  tube projects over distal esophagus, recommend advancing tube 10 cm.   Electronically Signed   By: Ulyses Southward M.D.   On: 04/21/2014 15:29   Dg Abd Portable 2v  04/22/2014   CLINICAL DATA:  Small bowel obstruction; assess for change  EXAM: PORTABLE ABDOMEN - 2 VIEW  COMPARISON:  Portable abdominal film of April 21, 2014  FINDINGS: There remain loops of moderately distended gas-filled small bowel throughout  the abdomen. Very little colonic gas or stool is demonstrated. There is contrast within the urinary bladder. The tip of the nasogastric tube is at the GE junction. The proximal port lies above it. The bony structures exhibit no acute abnormality.  IMPRESSION: Persistent distal small bowel obstructive pattern. Positioning of the nasogastric tube is suboptimal. Advancement of the tube by 10-15 cm is recommended to assure that both the proximal port and tip lie below the GE junction.   Electronically Signed   By: David  [place]   On: 04/22/2014 07:33    Anti-infectives: Anti-infectives    None       Assessment/Plan  1. SBO 2. Umbilical hernia  3. A fib  Plan: 1. Patient is feeling better today, but his abdominal films do not look much better.  Still with dilated loops of small bowel. 2. Not clear, but unsure that his hernia is the cause of his obstruction.   3. Since he is passing flatus, we will continue conservative management with NGT and repeat abdominal films in the morning.   4. Cont to hold coumadin in case he requires surgical intervention.  LOS: 1 day    Daisha Filosa E 04/22/2014, 9:00 AM Pager: [phone]

## 2014-04-22 NOTE — Plan of Care (Signed)
Problem: Phase I Progression Outcomes Goal: Pain controlled with appropriate interventions Outcome: Completed/Met Date Met:  04/22/14 Goal: OOB as tolerated unless otherwise ordered Outcome: Completed/Met Date Met:  04/22/14 Goal: Initial discharge plan identified Outcome: Progressing

## 2014-04-22 NOTE — Plan of Care (Signed)
Problem: Phase I Progression Outcomes Goal: Voiding-avoid urinary catheter unless indicated Outcome: Completed/Met Date Met:  04/22/14     

## 2014-04-22 NOTE — Progress Notes (Signed)
[facility] PROGRESS NOTE  Ricky Guzman ZOX:[id number] DOB: 10/09/1930 DOA: 04/21/2014 PCP: Georgann Housekeeper, MD  Assessment/Plan:  SBO -Pt presented with severe abdominal pain, nausea and vomiting- on exam today, passing flatus, no abdominal tenderness, significant NG output. -CT abd- high grade SBO that could be secondary to umbilical hernia, adhesions, or inflammatory stenosis/stricture. No obstructing mass, ischemia, or perforation. Additional findings of thickening of ileum suggestive of enteritis, and of distal esophagus suggestive of GERD.  -Repeat abd films- persistent SBO. -Appreciate surgery following- Continue conservative measures as pt is clinically better. Medical/cardiac clearance in case of   exploratory laparotomy. IV protonix 40 mg is added due to red NG drainage.  -Continue NG tube, bowel rest, IV hydration with D5 1/2 NS with 40 meq K, IV zofran, pain control PRN -Hold coumadin in case of surgery  -Repeat abd Xrays in am  Umbilical Hernia -Non-tender protuberant abdomen with reducible umbilical hernia -CT abd with hernia as possible etiology of SBO  A fib - Rate controlled -Coumadin held in case of surgery -Continue IV lopressor 2.5 q 6 PRN with parameters -INR normal -Continue tele monitoring  Lactic Acidosis -Resolved, with normal LA -Secondary to high grade SBO  Hypokalemia -Mild at 3.5 -IV fluids with of K -Repeat BMET in am  HTN -BP stable -Hold oral antihypertensive, continue IV lopressor   Glaucoma -continue eye drops  Obesity -BMI 30  DVT Prophylaxis;  SCDs Code Status: Full Family Communication: Daughter at bedside Disposition Plan: Inpatient   Consultants:  Surgery  Procedures:  None  Antibiotics:  None  HPI/Subjective: Ricky Guzman is a 80 yo male with PMH of umbilical hernia, Atrial fibrillation on chronic coumadin, HTN, glaucoma, and obesity that presents with severe, diffuse abdominal pain for past few days.  The pain was associated with nausea and vomiting.  He attributed the pain to eating fatty foods the day before. He denies any fever, chills, headache, chast pain, SOB, hematuria, changes to bowels,  Hematochezia, or black tarry stools. He went to PCP for evaluation of pain, and was instructed to come to ED.  In ED, abd CT with evidence of grade SBO, incidental findings of GERD and enteritis; no mass, perforation, or ischemia.  Lactic acid was elevated at 2.8.  Patient is admitted for further workup   States he is much better today.  Passing flatus.  Denies nausea, vomiting, or any abdominal pain. States protuberant abdomen is his baseline due to hernia  Objective: Filed Vitals:   04/22/14 0537  BP: 141/94  Pulse: 89  Temp: 98.5 F (36.9 C)  Resp: 22    Intake/Output Summary (Last 24 hours) at 04/22/14 1026 Last data filed at 04/22/14 0946  Gross per 24 hour  Intake 1001.25 ml  Output   2650 ml  Net -1648.75 ml   Filed Weights   04/21/14 1847  Weight: 94.62 kg (208 lb 9.6 oz)    Exam:  Gen: Alert AA male in NAD.  Lying comfortably in bed, with NG tube HEENT: Normocephalic, atraumatic.  Pupils symmertrical. Dry mucosa.  NG tube with green drainage Chest: clear to auscultate bilaterally, no ronchi or rales  Cardiac: irregular rate and rhythm, S1-S2, no rubs murmurs or gallops  Abdomen: soft, non tender, distended with reducible umbilical hernia. +bowel sounds. No guarding or rigidity.  Extremities: Symmetrical in appearance without cyanosis or edema  Neurological: Alert awake oriented to time place and person.  Psychiatric: Appears normal.   Data Reviewed: Basic Metabolic Panel:  Recent Labs Lab 04/21/14  1148 04/22/14 0526  NA 139 142  K 3.4* 3.5*  CL 96 103  CO2 25 25  GLUCOSE 142* 137*  BUN 19 20  CREATININE 1.09 0.98  CALCIUM 9.8 8.7   Liver Function Tests:  Recent Labs Lab 04/21/14 1148 04/22/14 0526  AST 17 13  ALT 10 8  ALKPHOS 59 42  BILITOT 1.8*  1.4*  PROT 8.1 6.9  ALBUMIN 3.7 3.0*    Recent Labs Lab 04/21/14 1148  LIPASE 11   No results for input(s): AMMONIA in the last 168 hours. CBC:  Recent Labs Lab 04/21/14 1148 04/22/14 0526  WBC 9.1 5.8  NEUTROABS 7.6  --   HGB 14.9 13.2  HCT 43.6 39.2  MCV 86.0 84.3  PLT 195 199   Cardiac Enzymes: No results for input(s): CKTOTAL, CKMB, CKMBINDEX, TROPONINI in the last 168 hours. BNP (last 3 results) No results for input(s): PROBNP in the last 8760 hours. CBG: No results for input(s): GLUCAP in the last 168 hours.  No results found for this or any previous visit (from the past 240 hour(s)).   Studies: Ct Abdomen Pelvis W Contrast  04/21/2014   CLINICAL DATA:  80 year old male with abdominal pain for the past 2 days accompanied by belching.  EXAM: CT ABDOMEN AND PELVIS WITH CONTRAST  TECHNIQUE: Multidetector CT imaging of the abdomen and pelvis was performed using the standard protocol following bolus administration of intravenous contrast.  CONTRAST:  100mL OMNIPAQUE IOHEXOL 300 MG/ML  SOLN  COMPARISON:  Acute abdominal series radiographs obtained earlier today  FINDINGS: Lower Chest: Trace right pleural effusion. Mild dependent atelectasis bilaterally. Cardiomegaly with biatrial enlargement. No pericardial effusion. Mild circumferential thickening of the visualized distal esophagus. This is nonspecific in appearance. No focal mass. There is a small hiatal hernia.  Abdomen: The stomach is distended and dilated. There is marked distention of the proximal small bowel with a focal transition point in the anterior mid abdomen just posterior to the abdominal fat containing umbilical hernia.  Unremarkable CT appearance of the spleen, pancreas, adrenal glands. Normal hepatic contour and morphology. Unremarkable CT appearance of the spleen, adrenal glands and pancreas. Normal hepatic contour and morphology. Circumscribed hypo attenuating lesions in the left hepatic lobe are most  suggestive of benign cysts. The smaller lesions are too small for accurate characterization. Gallbladder is unremarkable. No intra or extrahepatic biliary ductal dilatation.  Unremarkable appearance of the bilateral kidneys. No focal solid lesion, hydronephrosis or nephrolithiasis. 2.3 cm simple cyst in the interpolar left kidney. 7.3 cm simple cyst exophytic from the lower pole of the left kidney. Additional tiny (less than 1 cm) low-attenuation lesions bilaterally. While too small for accurate characterization, these are also statistically likely simple cysts. No definite enhancing lesion.  Extensive colonic diverticulosis without evidence of active inflammation. Nonspecific mild circumferential thickening of the terminal ileum. There is a small amounts of mesenteric edema and interloop fluid within the mesentery the affected loops of small bowel. No evidence of free air. Trace free fluid in the left pericolic gutter.  Small right inguinal hernia containing omental fat and a small volume of ascites.  Pelvis: Unremarkable bladder, seminal vesicles and prostate gland.  Bones/Soft Tissues: No acute fracture or aggressive appearing lytic or blastic osseous lesion. Multilevel degenerative disc disease throughout the lumbar spine. Prominent partially calcified disc protrusion at L4-L5 results in narrowing of the central canal.  Vascular: Minimal fusiform ectasia of the infrarenal abdominal aorta which measures only 2.3 cm. Scattered atherosclerotic vascular calcifications without aneurysmal  dilatation or focal abnormality.  IMPRESSION: 1. High-grade small bowel obstruction with focal transition point posterior to the omental fat containing umbilical hernia. No definite obstructing mass is identified. Suspected etiology of internal adhesions. If there is no history of prior surgical intervention, internal hernia would also be a consideration. Mild edema and interloop fluid in the affected small bowel mesenteric. No  evidence of bowel wall ischemia or perforation. 2. Nonspecific circumferential thickening of the terminal ileum with perhaps trace surrounding inflammatory stranding. Differential considerations include both infectious and inflammatory enteritis including Crohn's disease. If the patient does have a clinical history of Crohn's disease, the bowel obstruction could also be secondary to a region of focal inflammatory stenosis/stricture. 3. Extensive colonic diverticulosis without evidence of active diverticulitis. 4. Circumferential thickening of the distal thoracic esophagus. Recommend clinical correlation for signs and symptoms of gastroesophageal reflux disease. 5. Cardiomegaly with biatrial enlargement. 6. Trace right pleural effusion. 7. Hepatic and renal cysts. 8. Small right inguinal hernia containing omental fat and small volume ascites. 9. Mild atherosclerotic vascular calcification. 10. Multilevel degenerative disc disease.   Electronically Signed   By: Malachy MoanHeath  McCullough M.D.   On: 04/21/2014 14:02   Dg Abd 2 Views  04/21/2014   CLINICAL DATA:  Abdominal pain and bloating  EXAM: ABDOMEN - 2 VIEW  COMPARISON:  None.  FINDINGS: Supine and erect views of the abdomen show markedly distend loops of primarily small bowel measuring up to 5.6 cm in diameter consistent with high-grade small bowel obstruction. Very little colonic bowel gas is seen. No definite free air is noted on the erect view. CT of the abdomen pelvis may be helpful to determine point of obstruction. No opaque calculi are noted.  IMPRESSION: High-grade small bowel obstruction. Recommend CT of the abdomen pelvis to assess for point of obstruction. No definite free air.   Electronically Signed   By: Dwyane DeePaul  Barry M.D.   On: 04/21/2014 10:59   Dg Abd Portable 1v  04/21/2014   CLINICAL DATA:  Nasogastric tube placement  EXAM: PORTABLE ABDOMEN - 1 VIEW  COMPARISON:  Portable exam 1451 hr compared to 04/21/2014 at 1009 hr  FINDINGS: Tip of  nasogastric tube projects over distal esophagus ; recommend advancing tube 10 cm to place proximal side-port within proximal stomach.  Persistent small bowel dilatation.  Bibasilar atelectasis.  Osseous demineralization.  IMPRESSION: Tip of nasogastric tube projects over distal esophagus, recommend advancing tube 10 cm.   Electronically Signed   By: Ulyses SouthwardMark  Boles M.D.   On: 04/21/2014 15:29   Dg Abd Portable 2v  04/22/2014   CLINICAL DATA:  Small bowel obstruction; assess for change  EXAM: PORTABLE ABDOMEN - 2 VIEW  COMPARISON:  Portable abdominal film of April 21, 2014  FINDINGS: There remain loops of moderately distended gas-filled small bowel throughout the abdomen. Very little colonic gas or stool is demonstrated. There is contrast within the urinary bladder. The tip of the nasogastric tube is at the GE junction. The proximal port lies above it. The bony structures exhibit no acute abnormality.  IMPRESSION: Persistent distal small bowel obstructive pattern. Positioning of the nasogastric tube is suboptimal. Advancement of the tube by 10-15 cm is recommended to assure that both the proximal port and tip lie below the GE junction.   Electronically Signed   By: David  SwazilandJordan   On: 04/22/2014 07:33    Scheduled Meds: . antiseptic oral rinse  7 mL Mouth Rinse q12n4p  . chlorhexidine  15 mL Mouth Rinse BID  .  latanoprost  1 drop Both Eyes QHS  . pantoprazole (PROTONIX) IV  40 mg Intravenous Q24H  . potassium chloride  10 mEq Intravenous Q1 Hr x 3  . sodium chloride  3 mL Intravenous Q12H   Continuous Infusions: . dextrose 5 % and 0.9% NaCl 75 mL/hr at 04/21/14 2025    Principal Problem:   SBO (small bowel obstruction) Active Problems:   Essential hypertension   Obesity (BMI 30-39.9)   Glaucoma   Atrial fibrillation, chronic   Lactic acid acidosis   Long term current use of anticoagulant therapy    Time spent: 69   Illa Level [facility]  [facility] Pager [phone]. If 7PM-7AM,  please contact night-coverage at [email or web address], password [facility] 04/22/2014, 10:26 AM  LOS: 1 day

## 2014-04-22 NOTE — Progress Notes (Signed)
ANTICOAGULATION CONSULT NOTE - Initial Consult  Pharmacy Consult for heparin Indication: atrial fibrillation  No Known Allergies  Patient Measurements: Height: 5\' 10"  (177.8 cm) Weight: 208 lb 9.6 oz (94.62 kg) IBW/kg (Calculated) : 73 Heparin Dosing Weight: 80 kg  Vital Signs: Temp: 99.1 F (37.3 C) (11/18 1521) Temp Source: Oral (11/18 1521) BP: 146/85 mmHg (11/18 1341) Pulse Rate: 103 (11/18 1341)  Labs:  Recent Labs  04/21/14 1148 04/22/14 0526  HGB 14.9 13.2  HCT 43.6 39.2  PLT 195 199  LABPROT 16.2*  --   INR 1.29  --   CREATININE 1.09 0.98    Estimated Creatinine Clearance: 68.2 mL/min (by C-G formula based on Cr of 0.98).   Medical History: Past Medical History  Diagnosis Date  . Hypertension   . Arrhythmia   . Atrial fibrillation   . Rectal bleeding 06/17/2012  . SBO (small bowel obstruction) 04/2014    Medications:  Prescriptions prior to admission  Medication Sig Dispense Refill Last Dose  . benazepril (LOTENSIN) 20 MG tablet Take 20 mg by mouth daily.   04/20/2014 at Unknown time  . cholecalciferol (VITAMIN D) 1000 UNITS tablet Take 1,000 Units by mouth daily.   04/20/2014 at Unknown time  . furosemide (LASIX) 20 MG tablet Take 20 mg by mouth 2 (two) times daily.    04/20/2014 at Unknown time  . LUMIGAN 0.01 % SOLN Place 1 drop into both eyes at bedtime.    04/20/2014 at Unknown time  . terazosin (HYTRIN) 10 MG capsule Take 10 mg by mouth at bedtime.   04/20/2014 at Unknown time  . verapamil (CALAN-SR) 240 MG CR tablet Take 240 mg by mouth 2 (two) times daily.    04/20/2014 at Unknown time  . warfarin (COUMADIN) 5 MG tablet Take 2.5-5 mg by mouth daily. 2.5 mg on tue, thu and 5 mg all other days   04/20/2014 at Unknown time    Assessment: 80 yo man to start heparin while coumadin on hold for SBO.  INR 1.29 on 11/17.  No bleeding noted.  Hg 13.2, PTLC normal Goal of Therapy:  Heparin level 0.3-0.7 units/ml Monitor platelets by anticoagulation  protocol: Yes   Plan:  Heparin no bolus per MD.  Start drip at 1150 units/hr Check heparin level 8 hours after start. Daily HL and CBC Monitor for bleeding complications.  Thanks for allowing pharmacy to be a part of this patient's care.  Talbert CageLora Kleber Crean, PharmD Clinical Pharmacist, [phone] 04/22/2014,7:10 PM

## 2014-04-22 NOTE — Consult Note (Addendum)
Name: Ricky Guzman is a 80 y.o. male Admit date: 04/21/2014 Referring Physician:  [facility] Primary Physician:  Georgann HousekeeperKarrar Husain, M.D. Primary Cardiologist:  none  Reason for Consultation:  Preoperative Cardiovascular Clearance  ASSESSMENT:  1. High-grade small bowel obstruction 2. Chronic atrial fibrillation 3. Chronic anticoagulation therapy prior to admission because of atrial fibrillation 4. Essential hypertension 5. Chronic diastolic heart failure  PLAN:  1. Baseline brain natiuretic peptide and troponin2. 2-D Doppler echocardiogram. 2. Clearance is pending basic cardiovascular evaluation noted above, but at this point I do not see any clear evidence of a cardiovascular compromise would prevent surgery. 3. Maintaining good ventricular rate control 4. IV heparin for stroke prophylaxis    HPI: 80 year old gentleman who lives independently. 2 days prior to admission began having lower abdominal discomfort with some nausea. He was mentioned to the hospital with small bowel obstruction. He has a history of chronic atrial fibrillation on anticoagulation therapy. The patient denies having any heart condition. He denies ever having a heart attack or heart failure. He denies orthopnea, PND, syncope, palpitations, leg edema, wheezing, cough, and palpitations. There is no history of stroke. He does have a history of hypertension. He is on Verapamil SR, benazepril, furosemide, and Coumadin for embolic stroke prophylaxis.  PMH:   Past Medical History  Diagnosis Date  . Hypertension   . Arrhythmia   . Atrial fibrillation   . Rectal bleeding 06/17/2012  . SBO (small bowel obstruction) 04/2014    PSH:   Past Surgical History  Procedure Laterality Date  . No past surgeries    . Colonoscopy  06/18/2012    Procedure: COLONOSCOPY;  Surgeon: Florencia Reasonsobert V Buccini, MD;  [facility];  Service: Endoscopy;  Laterality: N/A;   Allergies:  Review of patient's allergies  indicates no known allergies. Prior to Admit Meds:   Prescriptions prior to admission  Medication Sig Dispense Refill Last Dose  . benazepril (LOTENSIN) 20 MG tablet Take 20 mg by mouth daily.   04/20/2014 at Unknown time  . cholecalciferol (VITAMIN D) 1000 UNITS tablet Take 1,000 Units by mouth daily.   04/20/2014 at Unknown time  . furosemide (LASIX) 20 MG tablet Take 20 mg by mouth 2 (two) times daily.    04/20/2014 at Unknown time  . LUMIGAN 0.01 % SOLN Place 1 drop into both eyes at bedtime.    04/20/2014 at Unknown time  . terazosin (HYTRIN) 10 MG capsule Take 10 mg by mouth at bedtime.   04/20/2014 at Unknown time  . verapamil (CALAN-SR) 240 MG CR tablet Take 240 mg by mouth 2 (two) times daily.    04/20/2014 at Unknown time  . warfarin (COUMADIN) 5 MG tablet Take 2.5-5 mg by mouth daily. 2.5 mg on tue, thu and 5 mg all other days   04/20/2014 at Unknown time   Fam HX:    Family History  Problem Relation Age of Onset  . Diabetes Brother    Social HX:    History   Social History  . Marital Status: Widowed    Spouse Name: N/A    Number of Children: N/A  . Years of Education: N/A   Occupational History  . Not on file.   Social History Main Topics  . Smoking status: Never Smoker   . Smokeless tobacco: Never Used  . Alcohol Use: Yes     Comment: "used to take a drink before going on BP pills in 1998; still have 1 drink on New Year's Eve; that's  it for the year"  . Drug Use: No  . Sexual Activity: No   Other Topics Concern  . Not on file   Social History Narrative     Review of Systems: No blood in his urine or stool. No history of stroke. No peripheral edema. Able to lie flat. Denies cancer. Appetite is been stable until 2-3 days prior to admission. Not a smoker, no history of lung disease, no history of cirrhosis or liver disease. Compliant with medication intake.  Physical Exam: Blood pressure 146/85, pulse 103, temperature 99.1 F (37.3 C), temperature source  Oral, resp. rate 28, height 5\' 10"  (1.778 m), weight 208 lb 9.6 oz (94.62 kg), SpO2 99 %. Weight change:    Pleasant gentleman who is in no acute distress. He has an NG tube in place and has some general discomfort. Skin is warm and dry.  HEENT exam reveals the NG tube as mentioned above. The conjunctiva are not pale. No jaundice is noted.  Chest reveals basilar crackles.  Cardiac exam reveals slight irregularity. 2/6 systolic murmur is heard. No diastolic murmurs heard. The murmur is loudest at the apex.  Abdomen is distended and mildly tender near the umbilical area.  Neurological exam reveals no significant focal deficits.  Extremities reveal no edema.  Labs: Lab Results  Component Value Date   WBC 5.8 04/22/2014   HGB 13.2 04/22/2014   HCT 39.2 04/22/2014   MCV 84.3 04/22/2014   PLT 199 04/22/2014    Recent Labs Lab 04/22/14 0526  NA 142  K 3.5*  CL 103  CO2 25  BUN 20  CREATININE 0.98  CALCIUM 8.7  PROT 6.9  BILITOT 1.4*  ALKPHOS 42  ALT 8  AST 13  GLUCOSE 137*   No results found for: PTT Lab Results  Component Value Date   INR 1.29 04/21/2014   INR 1.78* 06/18/2012   INR 1.94* 06/18/2012    Radiology:  Ct Abdomen Pelvis W Contrast  04/21/2014   CLINICAL DATA:  80 year old male with abdominal pain for the past 2 days accompanied by belching.  EXAM: CT ABDOMEN AND PELVIS WITH CONTRAST  TECHNIQUE: Multidetector CT imaging of the abdomen and pelvis was performed using the standard protocol following bolus administration of intravenous contrast.  CONTRAST:  100mL OMNIPAQUE IOHEXOL 300 MG/ML  SOLN  COMPARISON:  Acute abdominal series radiographs obtained earlier today  FINDINGS: Lower Chest: Trace right pleural effusion. Mild dependent atelectasis bilaterally. Cardiomegaly with biatrial enlargement. No pericardial effusion. Mild circumferential thickening of the visualized distal esophagus. This is nonspecific in appearance. No focal mass. There is a small hiatal  hernia.  Abdomen: The stomach is distended and dilated. There is marked distention of the proximal small bowel with a focal transition point in the anterior mid abdomen just posterior to the abdominal fat containing umbilical hernia.  Unremarkable CT appearance of the spleen, pancreas, adrenal glands. Normal hepatic contour and morphology. Unremarkable CT appearance of the spleen, adrenal glands and pancreas. Normal hepatic contour and morphology. Circumscribed hypo attenuating lesions in the left hepatic lobe are most suggestive of benign cysts. The smaller lesions are too small for accurate characterization. Gallbladder is unremarkable. No intra or extrahepatic biliary ductal dilatation.  Unremarkable appearance of the bilateral kidneys. No focal solid lesion, hydronephrosis or nephrolithiasis. 2.3 cm simple cyst in the interpolar left kidney. 7.3 cm simple cyst exophytic from the lower pole of the left kidney. Additional tiny (less than 1 cm) low-attenuation lesions bilaterally. While too small for accurate  characterization, these are also statistically likely simple cysts. No definite enhancing lesion.  Extensive colonic diverticulosis without evidence of active inflammation. Nonspecific mild circumferential thickening of the terminal ileum. There is a small amounts of mesenteric edema and interloop fluid within the mesentery the affected loops of small bowel. No evidence of free air. Trace free fluid in the left pericolic gutter.  Small right inguinal hernia containing omental fat and a small volume of ascites.  Pelvis: Unremarkable bladder, seminal vesicles and prostate gland.  Bones/Soft Tissues: No acute fracture or aggressive appearing lytic or blastic osseous lesion. Multilevel degenerative disc disease throughout the lumbar spine. Prominent partially calcified disc protrusion at L4-L5 results in narrowing of the central canal.  Vascular: Minimal fusiform ectasia of the infrarenal abdominal aorta which  measures only 2.3 cm. Scattered atherosclerotic vascular calcifications without aneurysmal dilatation or focal abnormality.  IMPRESSION: 1. High-grade small bowel obstruction with focal transition point posterior to the omental fat containing umbilical hernia. No definite obstructing mass is identified. Suspected etiology of internal adhesions. If there is no history of prior surgical intervention, internal hernia would also be a consideration. Mild edema and interloop fluid in the affected small bowel mesenteric. No evidence of bowel wall ischemia or perforation. 2. Nonspecific circumferential thickening of the terminal ileum with perhaps trace surrounding inflammatory stranding. Differential considerations include both infectious and inflammatory enteritis including Crohn's disease. If the patient does have a clinical history of Crohn's disease, the bowel obstruction could also be secondary to a region of focal inflammatory stenosis/stricture. 3. Extensive colonic diverticulosis without evidence of active diverticulitis. 4. Circumferential thickening of the distal thoracic esophagus. Recommend clinical correlation for signs and symptoms of gastroesophageal reflux disease. 5. Cardiomegaly with biatrial enlargement. 6. Trace right pleural effusion. 7. Hepatic and renal cysts. 8. Small right inguinal hernia containing omental fat and small volume ascites. 9. Mild atherosclerotic vascular calcification. 10. Multilevel degenerative disc disease.   Electronically Signed   By: Malachy Moan M.D.   On: 04/21/2014 14:02   Dg Chest Port 1 View  04/22/2014   CLINICAL DATA:  Fever.  EXAM: PORTABLE CHEST - 1 VIEW  COMPARISON:  September 13, 2005.  FINDINGS: Stable cardiomediastinal silhouette. No pneumothorax or pleural effusion is noted. Nasogastric tube is seen entering the stomach. Both lungs are clear. The visualized skeletal structures are unremarkable.  IMPRESSION: Nasogastric tube is seen entering stomach. No acute  cardiopulmonary abnormality seen.   Electronically Signed   By: Roque Lias M.D.   On: 04/22/2014 18:03   Dg Abd 2 Views  04/21/2014   CLINICAL DATA:  Abdominal pain and bloating  EXAM: ABDOMEN - 2 VIEW  COMPARISON:  None.  FINDINGS: Supine and erect views of the abdomen show markedly distend loops of primarily small bowel measuring up to 5.6 cm in diameter consistent with high-grade small bowel obstruction. Very little colonic bowel gas is seen. No definite free air is noted on the erect view. CT of the abdomen pelvis may be helpful to determine point of obstruction. No opaque calculi are noted.  IMPRESSION: High-grade small bowel obstruction. Recommend CT of the abdomen pelvis to assess for point of obstruction. No definite free air.   Electronically Signed   By: Dwyane Dee M.D.   On: 04/21/2014 10:59   Dg Abd Portable 1v  04/21/2014   CLINICAL DATA:  Nasogastric tube placement  EXAM: PORTABLE ABDOMEN - 1 VIEW  COMPARISON:  Portable exam 1451 hr compared to 04/21/2014 at 1009 hr  FINDINGS: Tip of  nasogastric tube projects over distal esophagus ; recommend advancing tube 10 cm to place proximal side-port within proximal stomach.  Persistent small bowel dilatation.  Bibasilar atelectasis.  Osseous demineralization.  IMPRESSION: Tip of nasogastric tube projects over distal esophagus, recommend advancing tube 10 cm.   Electronically Signed   By: Ulyses Southward M.D.   On: 04/21/2014 15:29   Dg Abd Portable 2v  04/22/2014   CLINICAL DATA:  Small bowel obstruction; assess for change  EXAM: PORTABLE ABDOMEN - 2 VIEW  COMPARISON:  Portable abdominal film of April 21, 2014  FINDINGS: There remain loops of moderately distended gas-filled small bowel throughout the abdomen. Very little colonic gas or stool is demonstrated. There is contrast within the urinary bladder. The tip of the nasogastric tube is at the GE junction. The proximal port lies above it. The bony structures exhibit no acute abnormality.   IMPRESSION: Persistent distal small bowel obstructive pattern. Positioning of the nasogastric tube is suboptimal. Advancement of the tube by 10-15 cm is recommended to assure that both the proximal port and tip lie below the GE junction.   Electronically Signed   By: David  [place]   On: 04/22/2014 07:33    EKG:  Atrial fibrillation with controlled ventricular response and nonspecific ST-T wave abnormality    Lesleigh Noe 04/22/2014 6:51 PM

## 2014-04-22 NOTE — Progress Notes (Signed)
Read ABD Xray results. Advanced NG tube 10-15cm. Pt tolerated well.

## 2014-04-23 ENCOUNTER — Inpatient Hospital Stay (HOSPITAL_COMMUNITY): Payer: Medicare Other

## 2014-04-23 DIAGNOSIS — I059 Rheumatic mitral valve disease, unspecified: Secondary | ICD-10-CM

## 2014-04-23 DIAGNOSIS — I482 Chronic atrial fibrillation, unspecified: Secondary | ICD-10-CM | POA: Insufficient documentation

## 2014-04-23 LAB — BASIC METABOLIC PANEL
ANION GAP: 13 (ref 5–15)
BUN: 16 mg/dL (ref 6–23)
CALCIUM: 8.4 mg/dL (ref 8.4–10.5)
CO2: 26 mEq/L (ref 19–32)
CREATININE: 1.11 mg/dL (ref 0.50–1.35)
Chloride: 106 mEq/L (ref 96–112)
GFR calc Af Amer: 70 mL/min — ABNORMAL LOW (ref >90)
GFR calc non Af Amer: 60 mL/min — ABNORMAL LOW (ref >90)
Glucose, Bld: 116 mg/dL — ABNORMAL HIGH (ref 70–99)
Potassium: 3.6 mEq/L — ABNORMAL LOW (ref 3.7–5.3)
Sodium: 145 mEq/L (ref 137–147)

## 2014-04-23 LAB — CBC
HCT: 37.7 % — ABNORMAL LOW (ref 39.0–52.0)
Hemoglobin: 12.4 g/dL — ABNORMAL LOW (ref 13.0–17.0)
MCH: 28.1 pg (ref 26.0–34.0)
MCHC: 32.9 g/dL (ref 30.0–36.0)
MCV: 85.5 fL (ref 78.0–100.0)
PLATELETS: 198 10*3/uL (ref 150–400)
RBC: 4.41 MIL/uL (ref 4.22–5.81)
RDW: 13.5 % (ref 11.5–15.5)
WBC: 7.7 10*3/uL (ref 4.0–10.5)

## 2014-04-23 LAB — GLUCOSE, CAPILLARY
Glucose-Capillary: 107 mg/dL — ABNORMAL HIGH (ref 70–99)
Glucose-Capillary: 111 mg/dL — ABNORMAL HIGH (ref 70–99)
Glucose-Capillary: 135 mg/dL — ABNORMAL HIGH (ref 70–99)
Glucose-Capillary: 136 mg/dL — ABNORMAL HIGH (ref 70–99)

## 2014-04-23 LAB — HEPARIN LEVEL (UNFRACTIONATED)
HEPARIN UNFRACTIONATED: 0.77 [IU]/mL — AB (ref 0.30–0.70)
Heparin Unfractionated: 0.65 IU/mL (ref 0.30–0.70)

## 2014-04-23 LAB — PROTIME-INR
INR: 1.36 (ref 0.00–1.49)
PROTHROMBIN TIME: 16.9 s — AB (ref 11.6–15.2)

## 2014-04-23 NOTE — Care Management Note (Unsigned)
    Page 1 of 1   04/23/2014     11:35:59 AM CARE MANAGEMENT NOTE 04/23/2014  Patient:  Pablo LedgerLEGRAND,Ronit   Account Number:  [id number]  Date Initiated:  04/23/2014  Documentation initiated by:  Letha CapeAYLOR,Kahil Agner  Subjective/Objective Assessment:   dx sbo, fever  admit- lives alone     Action/Plan:   Anticipated DC Date:  04/24/2014   Anticipated DC Plan:  HOME W HOME HEALTH SERVICES      DC Planning Services  CM consult      Choice offered to / List presented to:             Status of service:  In process, will continue to follow Medicare Important Message given?  YES (If response is "NO", the following Medicare IM given date fields will be blank) Date Medicare IM given:  04/23/2014 Medicare IM given by:  Letha CapeAYLOR,Joi Leyva Date Additional Medicare IM given:   Additional Medicare IM given by:    Discharge Disposition:    Per UR Regulation:    If discussed at Long Length of Stay Meetings, dates discussed:    Comments:  04/23/14 1134 Letha Capeeborah Aveion Nguyen RN, BSN [phone] [id number] patient lives alone, patient with SBO, has ng tube in place, on sips.  NCM will continue to follow for dc needs.

## 2014-04-23 NOTE — Progress Notes (Signed)
[facility] PROGRESS NOTE  Ricky Guzman ZOX:[id number] DOB: 03/14/1931 DOA: 04/21/2014 PCP: Georgann HousekeeperHUSAIN,KARRAR, MD  Assessment/Plan: SBO -Presented with severe abdominal pain, nausea and vomiting- exam (11/19)- passing flatus, abd soft, non-tender, less distension. -CT abd- high grade SBO secondary to either umbilical hernia, adhesions, or inflammatory stenosis/stricture. No mass, ischemia, or perforation. Additional findings of possible enteritis and GERD.  -Repeat abd films (11/19)- improving SBO.   -Surgery on board, rec's- clamp NGT (11/19) due to improvement. Continue IV Protonix due to red NG drainage -Continue bowel rest, IV hydration with D5 1/2 NS with 20 meq K, IV zofran, pain control PRN   Fever -Temp of 101.2 -CXR and UA with no acute findings -Blood and urine cxs pending -Tylenol PRN  Preoperative clearance -Surgery rec's- medical/cardiac clearance in case of laparotomy -BNP- elevated 3186  -Troponin x1- negative -2D echo (11/19)- EF 40-45%, severe LVH, diffuse hypokinesis, AV sclerosis, mild MR and RAE -Cardiology rec's-  IV heparin for stroke prevention -Hold Coumadin in case of surgery  Umbilical Hernia -Non-tender protuberant abdomen with reducible umbilical hernia -CT abd with hernia as possible etiology of SBO  A fib - Rate controlled -IV heparin per pharmacy  -Continue IV lopressor 2.5 q 6 PRN -INR normal -Hold Coumadin in case of surgery -Continue tele monitoring  Lactic Acidosis -Resolved, with normal LA -Secondary to high grade SBO  Hypokalemia -Mild at 3.6 -IV fluids with 20meq of K -Repeat BMET in am  Normocytic anemia -Mild at 12.4 -Likely dilutional, no signs/hx of bleeding -Monitor CBC in am  HTN -BP stable -Hold oral antihypertensive, continue IV lopressor   Glaucoma -continue eye drops  Obesity -BMI 30   DVT Prophylaxis; :SCDs Code Status: Full Family Communication: Daughter at bedside Disposition Plan:  Inpatient   Consultants:  Surgery  Cardiology  Procedures:  2D echocardiogram (11/19)- - LVEF40-45%, severe LVH, diffuse hypokinesis with more prounounced inferior hypokinesis, mild MR, mild RAE, upper normal LA size, aortic valve sclerosis with trivial AI  Antibiotics:  None  HPI/Subjective:  Ricky Guzman is a 80 yo male with PMH of umbilical hernia, Atrial fibrillation on chronic coumadin, HTN, glaucoma, and obesity that presents with severe, diffuse abdominal pain for past few days. The pain was associated with nausea and vomiting. He attributed the pain to eating fatty foods the day before. He denies any fever, chills, headache, chast pain, SOB, hematuria, changes to bowels, Hematochezia, or black tarry stools. He went to PCP for evaluation of pain, and was instructed to come to ED. In ED, abd CT with evidence of grade SBO, incidental findings of GERD and enteritis; no mass, perforation, or ischemia. Lactic acid was elevated at 2.8. Patient is admitted for further workup   States much better today. Passing flatus, no abd tenderness. No BM, asks about eating.   Objective: Filed Vitals:   04/23/14 0913  BP:   Pulse:   Temp: 98.1 F (36.7 C)  Resp:     Intake/Output Summary (Last 24 hours) at 04/23/14 1043 Last data filed at 04/23/14 [id number]  Gross per 24 hour  Intake    800 ml  Output   2000 ml  Net  -1200 ml   Filed Weights   04/21/14 1847  Weight: 94.62 kg (208 lb 9.6 oz)    Exam:  Gen: Alert AA male in NAD.  Appears comfortable in bed.  With clamped NGT HEENT: Normocephalic, atraumatic.  Pupils symmertrical.  Dry mucosa Chest: clear to auscultate bilaterally, no ronchi or rales  Cardiac: ast rate  with irregular rhythm. S1-S2, no rubs murmurs or gallops  Abdomen: soft, non tender, non distended, +bowel sounds. No guarding or rigidity  Extremities: Symmetrical in appearance without cyanosis or edema  Neurological: Alert awake oriented to time place  and person.  Psychiatric: Appears normal.   Data Reviewed: Basic Metabolic Panel:  Recent Labs Lab 04/21/14 1148 04/22/14 0526 04/23/14 0345  NA 139 142 145  K 3.4* 3.5* 3.6*  CL 96 103 106  CO2 25 25 26   GLUCOSE 142* 137* 116*  BUN 19 20 16   CREATININE 1.09 0.98 1.11  CALCIUM 9.8 8.7 8.4   Liver Function Tests:  Recent Labs Lab 04/21/14 1148 04/22/14 0526  AST 17 13  ALT 10 8  ALKPHOS 59 42  BILITOT 1.8* 1.4*  PROT 8.1 6.9  ALBUMIN 3.7 3.0*    Recent Labs Lab 04/21/14 1148  LIPASE 11   No results for input(s): AMMONIA in the last 168 hours. CBC:  Recent Labs Lab 04/21/14 1148 04/22/14 0526 04/23/14 0345  WBC 9.1 5.8 7.7  NEUTROABS 7.6  --   --   HGB 14.9 13.2 12.4*  HCT 43.6 39.2 37.7*  MCV 86.0 84.3 85.5  PLT 195 199 198   Cardiac Enzymes:  Recent Labs Lab 04/22/14 1930  TROPONINI <0.30   BNP (last 3 results)  Recent Labs  04/22/14 2110  PROBNP 3186.0*   CBG:  Recent Labs Lab 04/22/14 1151 04/22/14 1835 04/23/14 0008 04/23/14 0645  GLUCAP 125* 126* 111* 135*    No results found for this or any previous visit (from the past 240 hour(s)).   Studies: Ct Abdomen Pelvis W Contrast  04/21/2014   CLINICAL DATA:  80 year old male with abdominal pain for the past 2 days accompanied by belching.  EXAM: CT ABDOMEN AND PELVIS WITH CONTRAST  TECHNIQUE: Multidetector CT imaging of the abdomen and pelvis was performed using the standard protocol following bolus administration of intravenous contrast.  CONTRAST:  OMNIPAQUE IOHEXOL 300 MG/ML  SOLN  COMPARISON:  Acute abdominal series radiographs obtained earlier today  FINDINGS: Lower Chest: Trace right pleural effusion. Mild dependent atelectasis bilaterally. Cardiomegaly with biatrial enlargement. No pericardial effusion. Mild circumferential thickening of the visualized distal esophagus. This is nonspecific in appearance. No focal mass. There is a small hiatal hernia.  Abdomen: The stomach  is distended and dilated. There is marked distention of the proximal small bowel with a focal transition point in the anterior mid abdomen just posterior to the abdominal fat containing umbilical hernia.  Unremarkable CT appearance of the spleen, pancreas, adrenal glands. Normal hepatic contour and morphology. Unremarkable CT appearance of the spleen, adrenal glands and pancreas. Normal hepatic contour and morphology. Circumscribed hypo attenuating lesions in the left hepatic lobe are most suggestive of benign cysts. The smaller lesions are too small for accurate characterization. Gallbladder is unremarkable. No intra or extrahepatic biliary ductal dilatation.  Unremarkable appearance of the bilateral kidneys. No focal solid lesion, hydronephrosis or nephrolithiasis. 2.3 cm simple cyst in the interpolar left kidney. 7.3 cm simple cyst exophytic from the lower pole of the left kidney. Additional tiny (less than 1 cm) low-attenuation lesions bilaterally. While too small for accurate characterization, these are also statistically likely simple cysts. No definite enhancing lesion.  Extensive colonic diverticulosis without evidence of active inflammation. Nonspecific mild circumferential thickening of the terminal ileum. There is a small amounts of mesenteric edema and interloop fluid within the mesentery the affected loops of small bowel. No evidence of free air. Trace free  fluid in the left pericolic gutter.  Small right inguinal hernia containing omental fat and a small volume of ascites.  Pelvis: Unremarkable bladder, seminal vesicles and prostate gland.  Bones/Soft Tissues: No acute fracture or aggressive appearing lytic or blastic osseous lesion. Multilevel degenerative disc disease throughout the lumbar spine. Prominent partially calcified disc protrusion at L4-L5 results in narrowing of the central canal.  Vascular: Minimal fusiform ectasia of the infrarenal abdominal aorta which measures only 2.3 cm. Scattered  atherosclerotic vascular calcifications without aneurysmal dilatation or focal abnormality.  IMPRESSION: 1. High-grade small bowel obstruction with focal transition point posterior to the omental fat containing umbilical hernia. No definite obstructing mass is identified. Suspected etiology of internal adhesions. If there is no history of prior surgical intervention, internal hernia would also be a consideration. Mild edema and interloop fluid in the affected small bowel mesenteric. No evidence of bowel wall ischemia or perforation. 2. Nonspecific circumferential thickening of the terminal ileum with perhaps trace surrounding inflammatory stranding. Differential considerations include both infectious and inflammatory enteritis including Crohn's disease. If the patient does have a clinical history of Crohn's disease, the bowel obstruction could also be secondary to a region of focal inflammatory stenosis/stricture. 3. Extensive colonic diverticulosis without evidence of active diverticulitis. 4. Circumferential thickening of the distal thoracic esophagus. Recommend clinical correlation for signs and symptoms of gastroesophageal reflux disease. 5. Cardiomegaly with biatrial enlargement. 6. Trace right pleural effusion. 7. Hepatic and renal cysts. 8. Small right inguinal hernia containing omental fat and small volume ascites. 9. Mild atherosclerotic vascular calcification. 10. Multilevel degenerative disc disease.   Electronically Signed   By: Malachy Moan M.D.   On: 04/21/2014 14:02   Dg Chest Port 1 View  04/22/2014   CLINICAL DATA:  Fever.  EXAM: PORTABLE CHEST - 1 VIEW  COMPARISON:  September 13, 2005.  FINDINGS: Stable cardiomediastinal silhouette. No pneumothorax or pleural effusion is noted. Nasogastric tube is seen entering the stomach. Both lungs are clear. The visualized skeletal structures are unremarkable.  IMPRESSION: Nasogastric tube is seen entering stomach. No acute cardiopulmonary abnormality  seen.   Electronically Signed   By: Roque Lias M.D.   On: 04/22/2014 18:03   Dg Abd 2 Views  04/23/2014   CLINICAL DATA:  Week. Shortness of breath. Small bowel obstruction.  EXAM: ABDOMEN - 2 VIEW  COMPARISON:  04/22/2014  FINDINGS: The tip of the nasogastric tube is just below the GE junction. There has been interval improvement in abnormal small bowel distention with increase in colonic gas. No free intraperitoneal air identified.  IMPRESSION: 1. Improving small bowel obstruction.   Electronically Signed   By: Signa Kell M.D.   On: 04/23/2014 09:10   Dg Abd Portable 1v  04/21/2014   CLINICAL DATA:  Nasogastric tube placement  EXAM: PORTABLE ABDOMEN - 1 VIEW  COMPARISON:  Portable exam 1451 hr compared to 04/21/2014 at 1009 hr  FINDINGS: Tip of nasogastric tube projects over distal esophagus ; recommend advancing tube 10 cm to place proximal side-port within proximal stomach.  Persistent small bowel dilatation.  Bibasilar atelectasis.  Osseous demineralization.  IMPRESSION: Tip of nasogastric tube projects over distal esophagus, recommend advancing tube 10 cm.   Electronically Signed   By: Ulyses Southward M.D.   On: 04/21/2014 15:29   Dg Abd Portable 2v  04/22/2014   CLINICAL DATA:  Small bowel obstruction; assess for change  EXAM: PORTABLE ABDOMEN - 2 VIEW  COMPARISON:  Portable abdominal film of April 21, 2014  FINDINGS:  There remain loops of moderately distended gas-filled small bowel throughout the abdomen. Very little colonic gas or stool is demonstrated. There is contrast within the urinary bladder. The tip of the nasogastric tube is at the GE junction. The proximal port lies above it. The bony structures exhibit no acute abnormality.  IMPRESSION: Persistent distal small bowel obstructive pattern. Positioning of the nasogastric tube is suboptimal. Advancement of the tube by 10-15 cm is recommended to assure that both the proximal port and tip lie below the GE junction.   Electronically  Signed   By: David  [place]   On: 04/22/2014 07:33    Scheduled Meds: . antiseptic oral rinse  7 mL Mouth Rinse q12n4p  . chlorhexidine  15 mL Mouth Rinse BID  . latanoprost  1 drop Both Eyes QHS  . pantoprazole (PROTONIX) IV  40 mg Intravenous Q24H  . sodium chloride  3 mL Intravenous Q12H   Continuous Infusions: . dextrose 5 % and 0.45% NaCl 1,000 mL with potassium chloride 20 mEq infusion 75 mL/hr at 04/23/14 0242  . heparin 1,450 Units/hr (04/23/14 0526)    Principal Problem:   SBO (small bowel obstruction) Active Problems:   Essential hypertension   Obesity (BMI 30-39.9)   Glaucoma   Atrial fibrillation, chronic   Lactic acid acidosis   Long term current use of anticoagulant therapy   Fever   Hypokalemia    Time spent: 4445    Illa LevelOsman, [facility]  [facility] Pager [phone]. If 7PM-7AM, please contact night-coverage at [email or web address], password [email or web address] 04/23/2014, 10:43 AM  LOS: 2 days

## 2014-04-23 NOTE — Progress Notes (Signed)
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 without rales, rhonchi, or wheezes ZOX:WRUEAbd:soft, non tender, ? BS, do not palpate liver spleen or masses, NG clamped Ext:no lower ext edema, 2+ pedal pulses, 2+ radial pulses Neuro:alert and oriented X 3, MAE, follows commands, + facial symmetry   Lab Results:  Recent Labs  04/22/14 0526 04/23/14 0345  WBC 5.8 7.7  HGB 13.2 12.4*  HCT 39.2 37.7*  PLT 199 198   BMET  Recent Labs  04/22/14 0526 04/23/14 0345  NA 142 145  K 3.5* 3.6*  CL 103 106  CO2 25 26  GLUCOSE 137* 116*  BUN 20 16  CREATININE 0.98 1.11  CALCIUM 8.7 8.4    Recent Labs  04/22/14 1930  TROPONINI <0.30    No results found for: CHOL, HDL, LDLCALC, LDLDIRECT, TRIG, CHOLHDL No results found for: [id number]   No results found for: TSH  Hepatic Function Panel  Recent Labs  04/22/14 0526  PROT 6.9  ALBUMIN 3.0*  AST 13  ALT 8  ALKPHOS 42  BILITOT 1.4*   No results for input(s): CHOL in the last 72 hours. No results for input(s): PROTIME in the last 72 hours.     Studies/Results: Ct Abdomen Pelvis W Contrast  04/21/2014   CLINICAL DATA:  80 year old male with  abdominal pain for the past 2 days accompanied by belching.  EXAM: CT ABDOMEN AND PELVIS WITH CONTRAST  TECHNIQUE: Multidetector CT imaging of the abdomen and pelvis was performed using the standard protocol following bolus administration of intravenous contrast.  CONTRAST:  100mL OMNIPAQUE IOHEXOL 300 MG/ML  SOLN  COMPARISON:  Acute abdominal series radiographs obtained earlier today  FINDINGS: Lower Chest: Trace right pleural effusion. Mild dependent atelectasis bilaterally. Cardiomegaly with biatrial enlargement. No pericardial effusion. Mild circumferential thickening of the visualized distal esophagus. This is nonspecific in appearance. No focal mass. There is a small hiatal hernia.  Abdomen: The stomach is distended and dilated. There is marked distention of the proximal small bowel with a focal transition point in the anterior mid abdomen just posterior to the abdominal fat containing umbilical hernia.  Unremarkable CT appearance of the spleen, pancreas, adrenal glands. Normal hepatic contour and morphology. Unremarkable CT appearance of the spleen, adrenal glands and pancreas. Normal hepatic contour and morphology. Circumscribed hypo attenuating lesions in the left hepatic lobe are most suggestive of benign cysts. The smaller lesions are too small for accurate characterization. Gallbladder is unremarkable. No intra or extrahepatic biliary ductal dilatation.  Unremarkable appearance of the bilateral kidneys. No focal solid lesion, hydronephrosis or nephrolithiasis. 2.3 cm simple cyst in the interpolar left kidney. 7.3 cm simple cyst exophytic from the lower pole of the left kidney. Additional tiny (less than  1 cm) low-attenuation lesions bilaterally. While too small for accurate characterization, these are also statistically likely simple cysts. No definite enhancing lesion.  Extensive colonic diverticulosis without evidence of active inflammation. Nonspecific mild circumferential thickening of the terminal  ileum. There is a small amounts of mesenteric edema and interloop fluid within the mesentery the affected loops of small bowel. No evidence of free air. Trace free fluid in the left pericolic gutter.  Small right inguinal hernia containing omental fat and a small volume of ascites.  Pelvis: Unremarkable bladder, seminal vesicles and prostate gland.  Bones/Soft Tissues: No acute fracture or aggressive appearing lytic or blastic osseous lesion. Multilevel degenerative disc disease throughout the lumbar spine. Prominent partially calcified disc protrusion at L4-L5 results in narrowing of the central canal.  Vascular: Minimal fusiform ectasia of the infrarenal abdominal aorta which measures only 2.3 cm. Scattered atherosclerotic vascular calcifications without aneurysmal dilatation or focal abnormality.  IMPRESSION: 1. High-grade small bowel obstruction with focal transition point posterior to the omental fat containing umbilical hernia. No definite obstructing mass is identified. Suspected etiology of internal adhesions. If there is no history of prior surgical intervention, internal hernia would also be a consideration. Mild edema and interloop fluid in the affected small bowel mesenteric. No evidence of bowel wall ischemia or perforation. 2. Nonspecific circumferential thickening of the terminal ileum with perhaps trace surrounding inflammatory stranding. Differential considerations include both infectious and inflammatory enteritis including Crohn's disease. If the patient does have a clinical history of Crohn's disease, the bowel obstruction could also be secondary to a region of focal inflammatory stenosis/stricture. 3. Extensive colonic diverticulosis without evidence of active diverticulitis. 4. Circumferential thickening of the distal thoracic esophagus. Recommend clinical correlation for signs and symptoms of gastroesophageal reflux disease. 5. Cardiomegaly with biatrial enlargement. 6. Trace right pleural  effusion. 7. Hepatic and renal cysts. 8. Small right inguinal hernia containing omental fat and small volume ascites. 9. Mild atherosclerotic vascular calcification. 10. Multilevel degenerative disc disease.   Electronically Signed   By: Malachy Moan M.D.   On: 04/21/2014 14:02   Dg Chest Port 1 View  04/22/2014   CLINICAL DATA:  Fever.  EXAM: PORTABLE CHEST - 1 VIEW  COMPARISON:  September 13, 2005.  FINDINGS: Stable cardiomediastinal silhouette. No pneumothorax or pleural effusion is noted. Nasogastric tube is seen entering the stomach. Both lungs are clear. The visualized skeletal structures are unremarkable.  IMPRESSION: Nasogastric tube is seen entering stomach. No acute cardiopulmonary abnormality seen.   Electronically Signed   By: Roque Lias M.D.   On: 04/22/2014 18:03   Dg Abd 2 Views  04/23/2014   CLINICAL DATA:  Week. Shortness of breath. Small bowel obstruction.  EXAM: ABDOMEN - 2 VIEW  COMPARISON:  04/22/2014  FINDINGS: The tip of the nasogastric tube is just below the GE junction. There has been interval improvement in abnormal small bowel distention with increase in colonic gas. No free intraperitoneal air identified.  IMPRESSION: 1. Improving small bowel obstruction.   Electronically Signed   By: Signa Kell M.D.   On: 04/23/2014 09:10   Dg Abd Portable 1v  04/21/2014   CLINICAL DATA:  Nasogastric tube placement  EXAM: PORTABLE ABDOMEN - 1 VIEW  COMPARISON:  Portable exam 1451 hr compared to 04/21/2014 at 1009 hr  FINDINGS: Tip of nasogastric tube projects over distal esophagus ; recommend advancing tube 10 cm to place proximal side-port within proximal stomach.  Persistent small bowel dilatation.  Bibasilar atelectasis.  Osseous demineralization.  IMPRESSION: Tip  of nasogastric tube projects over distal esophagus, recommend advancing tube 10 cm.   Electronically Signed   By: Ulyses Southward M.D.   On: 04/21/2014 15:29   Dg Abd Portable 2v  04/22/2014   CLINICAL DATA:  Small bowel  obstruction; assess for change  EXAM: PORTABLE ABDOMEN - 2 VIEW  COMPARISON:  Portable abdominal film of April 21, 2014  FINDINGS: There remain loops of moderately distended gas-filled small bowel throughout the abdomen. Very little colonic gas or stool is demonstrated. There is contrast within the urinary bladder. The tip of the nasogastric tube is at the GE junction. The proximal port lies above it. The bony structures exhibit no acute abnormality.  IMPRESSION: Persistent distal small bowel obstructive pattern. Positioning of the nasogastric tube is suboptimal. Advancement of the tube by 10-15 cm is recommended to assure that both the proximal port and tip lie below the GE junction.   Electronically Signed   By: David  [place]   On: 04/22/2014 07:33   2D Echo: Left ventricle: The cavity size was normal. Wall thickness was increased in a pattern of severe LVH. Systolic function was mildly to moderately reduced. The estimated ejection fraction was in the range of 40% to 45%. Diffuse hypokinesis with more prounounced inferior hypokinesis and incoordinate septal motion. The study is not technically sufficient to allow evaluation of LV diastolic function. - Aortic valve: Trileaflet. Sclerosis without stenosis. There was trivial regurgitation. - Mitral valve: There was mild regurgitation. - Right atrium: The atrium was mildly dilated. - Tricuspid valve: There was mild regurgitation. - Pulmonary arteries: PA peak pressure: 45 mm Hg (S).  Impressions:  - LVEF40-45%, severe LVH, diffuse hypokinesis with more prounounced inferior hypokinesis, mild MR, mild RAE, upper normal LA size, aortic valve sclerosis with trivial AI   Medications: I have reviewed the patient's current medications. Scheduled Meds: . antiseptic oral rinse  7 mL Mouth Rinse q12n4p  . chlorhexidine  15 mL Mouth Rinse BID  . latanoprost  1 drop Both Eyes QHS  . pantoprazole (PROTONIX) IV  40 mg Intravenous  Q24H  . sodium chloride  3 mL Intravenous Q12H   Continuous Infusions: . dextrose 5 % and 0.45% NaCl 1,000 mL with potassium chloride 20 mEq infusion 75 mL/hr at 04/23/14 0242  . heparin 1,450 Units/hr (04/23/14 0526)   PRN Meds:.acetaminophen, metoprolol, morphine injection, ondansetron **OR** ondansetron (ZOFRAN) IV  Assessment/Plan: 80 year old gentleman who lives independently. 2 days prior to admission began having lower abdominal discomfort with some nausea. He was mentioned to the hospital with small bowel obstruction. He has a history of chronic atrial fibrillation on anticoagulation therapy. The patient denies having any heart condition. He denies ever having a heart attack or heart failure. He denies orthopnea, PND, syncope, palpitations, leg edema, wheezing, cough, and palpitations. There is no history of stroke. He does have a history of hypertension. He is on Verapamil SR, benazepril, furosemide, and Coumadin for embolic stroke prophylaxis  1. High-grade small bowel obstruction- now improving, NG clamped today surgery monitoring    2. Chronic atrial fibrillation- rate is running at 100 average, +PVCs., HR up with fever up to 101. Off verapamil due to NPO, if HR increases or BP elevated may need IV lopressor or dilt while NPO, currently stable.   3. Chronic anticoagulation therapy prior to admission because of atrial fibrillation , INR now 1.36, on IV heparin.  4. Essential hypertension- mildly elevated 144/88  5. Chronic diastolic heart failure- pro BNP 3186 yesterday//troponin neg <0.30 Echo  with severe LVH and diffuse hypokinesis with more prounounced inferior hypokinesis, mild MR, mild RAE, upper normal LA size, aortic valve sclerosis with trivial AI.  6. Fever-  Followed by IM and Surg.     LOS: 2 days   Time spent with pt. :15 minutes. [facility]tonNGOLD,LAURA R  Nurse Practitioner Certified Pager [phone] or after 5pm and on weekends call [phone] 04/23/2014, 11:40  AM   I have examined the patient and reviewed assessment and plan and discussed with patient.  Agree with above as stated.  Afib: rate controlled. IV heparin for stroke prevention. COnservative mgmt for SBO at this time.  Dallie Patton S.

## 2014-04-23 NOTE — Progress Notes (Signed)
NG tube clamped at this moment.

## 2014-04-23 NOTE — Progress Notes (Signed)
ANTICOAGULATION CONSULT NOTE - Follow Up Consult  Pharmacy Consult for heparin  Indication: atrial fibrillation  No Known Allergies  Patient Measurements: Height: 5\' 10"  (177.8 cm) Weight: 208 lb 9.6 oz (94.62 kg) IBW/kg (Calculated) : 73 Heparin Dosing Weight:   Vital Signs: Temp: 98.6 F (37 C) (11/18 2135) Temp Source: Oral (11/18 2135) BP: 144/88 mmHg (11/18 2135) Pulse Rate: 101 (11/18 2135)  Labs:  Recent Labs  04/21/14 1148 04/22/14 0526 04/22/14 1930 04/23/14 0345  HGB 14.9 13.2  --  12.4*  HCT 43.6 39.2  --  37.7*  PLT 195 199  --  198  LABPROT 16.2*  --   --  16.9*  INR 1.29  --   --  1.36  HEPARINUNFRC  --   --   --  <0.10*  CREATININE 1.09 0.98  --  1.11  TROPONINI  --   --  <0.30  --     Estimated Creatinine Clearance: 60.2 mL/min (by C-G formula based on Cr of 1.11).   Medications:  Heparin infusing at 1150 units./hr  Assessment: Heparin for afib. HL is undetectable on 12 units/kg/hr  No infusion related issues no bleeding noted.  Goal of Therapy:  Heparin level 0.3-0.7 units/ml Monitor platelets by anticoagulation protocol: Yes   Plan:  Increase heparin drip to 1450/hr. And recheck HL in 8 hours.   Ricky Guzman, Ricky Guzman Ricky Guzman 04/23/2014,5:24 AM

## 2014-04-23 NOTE — Progress Notes (Signed)
SUBJECTIVE:  Resting comfortably  OBJECTIVE:   Vitals:   Filed Vitals:   04/22/14 1521 04/22/14 2135 04/23/14 0626 04/23/14 0913  BP:  144/88 156/93   Pulse:  101 102   Temp: 99.1 F (37.3 C) 98.6 F (37 C) 100.5 F (38.1 C) 98.1 F (36.7 C)  TempSrc: Oral Oral Oral Oral  Resp:  22 22   Height:      Weight:      SpO2:  98% 98%    I&O's:   Intake/Output Summary (Last 24 hours) at 04/23/14 1203 Last data filed at 04/23/14 1155  Gross per 24 hour  Intake 971.25 ml  Output   1600 ml  Net -628.75 ml   TELEMETRY: Reviewed telemetry pt in AFib. Borderline rate control:     PHYSICAL EXAM General: Well developed, well nourished, in no acute distress Head:   Normal cephalic and atramatic  Lungs:  No wheezing Heart:  Irregularly irregular       LABS: Basic Metabolic Panel:  Recent Labs  16/10/96 0526 04/23/14 0345  NA 142 145  K 3.5* 3.6*  CL 103 106  CO2 25 26  GLUCOSE 137* 116*  BUN 20 16  CREATININE 0.98 1.11  CALCIUM 8.7 8.4   Liver Function Tests:  Recent Labs  04/21/14 1148 04/22/14 0526  AST 17 13  ALT 10 8  ALKPHOS 59 42  BILITOT 1.8* 1.4*  PROT 8.1 6.9  ALBUMIN 3.7 3.0*    Recent Labs  04/21/14 1148  LIPASE 11   CBC:  Recent Labs  04/21/14 1148 04/22/14 0526 04/23/14 0345  WBC 9.1 5.8 7.7  NEUTROABS 7.6  --   --   HGB 14.9 13.2 12.4*  HCT 43.6 39.2 37.7*  MCV 86.0 84.3 85.5  PLT 195 199 198   Cardiac Enzymes:  Recent Labs  04/22/14 1930  TROPONINI <0.30   BNP: Invalid input(s): POCBNP D-Dimer: No results for input(s): DDIMER in the last 72 hours. Hemoglobin A1C: No results for input(s): HGBA1C in the last 72 hours. Fasting Lipid Panel: No results for input(s): CHOL, HDL, LDLCALC, TRIG, CHOLHDL, LDLDIRECT in the last 72 hours. Thyroid Function Tests: No results for input(s): TSH, T4TOTAL, T3FREE, THYROIDAB in the last 72 hours.  Invalid input(s): FREET3 Anemia Panel: No results for input(s): VITAMINB12,  FOLATE, FERRITIN, TIBC, IRON, RETICCTPCT in the last 72 hours. Coag Panel:   Lab Results  Component Value Date   INR 1.36 04/23/2014   INR 1.29 04/21/2014   INR 1.78* 06/18/2012    RADIOLOGY: Ct Abdomen Pelvis W Contrast  04/21/2014   CLINICAL DATA:  80 year old male with abdominal pain for the past 2 days accompanied by belching.  EXAM: CT ABDOMEN AND PELVIS WITH CONTRAST  TECHNIQUE: Multidetector CT imaging of the abdomen and pelvis was performed using the standard protocol following bolus administration of intravenous contrast.  CONTRAST:  OMNIPAQUE IOHEXOL 300 MG/ML  SOLN  COMPARISON:  Acute abdominal series radiographs obtained earlier today  FINDINGS: Lower Chest: Trace right pleural effusion. Mild dependent atelectasis bilaterally. Cardiomegaly with biatrial enlargement. No pericardial effusion. Mild circumferential thickening of the visualized distal esophagus. This is nonspecific in appearance. No focal mass. There is a small hiatal hernia.  Abdomen: The stomach is distended and dilated. There is marked distention of the proximal small bowel with a focal transition point in the anterior mid abdomen just posterior to the abdominal fat containing umbilical hernia.  Unremarkable CT appearance of the spleen, pancreas, adrenal glands. Normal hepatic  contour and morphology. Unremarkable CT appearance of the spleen, adrenal glands and pancreas. Normal hepatic contour and morphology. Circumscribed hypo attenuating lesions in the left hepatic lobe are most suggestive of benign cysts. The smaller lesions are too small for accurate characterization. Gallbladder is unremarkable. No intra or extrahepatic biliary ductal dilatation.  Unremarkable appearance of the bilateral kidneys. No focal solid lesion, hydronephrosis or nephrolithiasis. 2.3 cm simple cyst in the interpolar left kidney. 7.3 cm simple cyst exophytic from the lower pole of the left kidney. Additional tiny (less than 1 cm) low-attenuation  lesions bilaterally. While too small for accurate characterization, these are also statistically likely simple cysts. No definite enhancing lesion.  Extensive colonic diverticulosis without evidence of active inflammation. Nonspecific mild circumferential thickening of the terminal ileum. There is a small amounts of mesenteric edema and interloop fluid within the mesentery the affected loops of small bowel. No evidence of free air. Trace free fluid in the left pericolic gutter.  Small right inguinal hernia containing omental fat and a small volume of ascites.  Pelvis: Unremarkable bladder, seminal vesicles and prostate gland.  Bones/Soft Tissues: No acute fracture or aggressive appearing lytic or blastic osseous lesion. Multilevel degenerative disc disease throughout the lumbar spine. Prominent partially calcified disc protrusion at L4-L5 results in narrowing of the central canal.  Vascular: Minimal fusiform ectasia of the infrarenal abdominal aorta which measures only 2.3 cm. Scattered atherosclerotic vascular calcifications without aneurysmal dilatation or focal abnormality.  IMPRESSION: 1. High-grade small bowel obstruction with focal transition point posterior to the omental fat containing umbilical hernia. No definite obstructing mass is identified. Suspected etiology of internal adhesions. If there is no history of prior surgical intervention, internal hernia would also be a consideration. Mild edema and interloop fluid in the affected small bowel mesenteric. No evidence of bowel wall ischemia or perforation. 2. Nonspecific circumferential thickening of the terminal ileum with perhaps trace surrounding inflammatory stranding. Differential considerations include both infectious and inflammatory enteritis including Crohn's disease. If the patient does have a clinical history of Crohn's disease, the bowel obstruction could also be secondary to a region of focal inflammatory stenosis/stricture. 3. Extensive  colonic diverticulosis without evidence of active diverticulitis. 4. Circumferential thickening of the distal thoracic esophagus. Recommend clinical correlation for signs and symptoms of gastroesophageal reflux disease. 5. Cardiomegaly with biatrial enlargement. 6. Trace right pleural effusion. 7. Hepatic and renal cysts. 8. Small right inguinal hernia containing omental fat and small volume ascites. 9. Mild atherosclerotic vascular calcification. 10. Multilevel degenerative disc disease.   Electronically Signed   By: Malachy MoanHeath  McCullough M.D.   On: 04/21/2014 14:02   Dg Chest Port 1 View  04/22/2014   CLINICAL DATA:  Fever.  EXAM: PORTABLE CHEST - 1 VIEW  COMPARISON:  September 13, 2005.  FINDINGS: Stable cardiomediastinal silhouette. No pneumothorax or pleural effusion is noted. Nasogastric tube is seen entering the stomach. Both lungs are clear. The visualized skeletal structures are unremarkable.  IMPRESSION: Nasogastric tube is seen entering stomach. No acute cardiopulmonary abnormality seen.   Electronically Signed   By: Roque LiasJames  Green M.D.   On: 04/22/2014 18:03   Dg Abd 2 Views  04/23/2014   CLINICAL DATA:  Week. Shortness of breath. Small bowel obstruction.  EXAM: ABDOMEN - 2 VIEW  COMPARISON:  04/22/2014  FINDINGS: The tip of the nasogastric tube is just below the GE junction. There has been interval improvement in abnormal small bowel distention with increase in colonic gas. No free intraperitoneal air identified.  IMPRESSION: 1. Improving  small bowel obstruction.   Electronically Signed   By: Signa Kellaylor  Stroud M.D.   On: 04/23/2014 09:10   Dg Abd 2 Views  04/21/2014   CLINICAL DATA:  Abdominal pain and bloating  EXAM: ABDOMEN - 2 VIEW  COMPARISON:  None.  FINDINGS: Supine and erect views of the abdomen show markedly distend loops of primarily small bowel measuring up to 5.6 cm in diameter consistent with high-grade small bowel obstruction. Very little colonic bowel gas is seen. No definite free air is  noted on the erect view. CT of the abdomen pelvis may be helpful to determine point of obstruction. No opaque calculi are noted.  IMPRESSION: High-grade small bowel obstruction. Recommend CT of the abdomen pelvis to assess for point of obstruction. No definite free air.   Electronically Signed   By: Dwyane DeePaul  Barry M.D.   On: 04/21/2014 10:59   Dg Abd Portable 1v  04/21/2014   CLINICAL DATA:  Nasogastric tube placement  EXAM: PORTABLE ABDOMEN - 1 VIEW  COMPARISON:  Portable exam 1451 hr compared to 04/21/2014 at 1009 hr  FINDINGS: Tip of nasogastric tube projects over distal esophagus ; recommend advancing tube 10 cm to place proximal side-port within proximal stomach.  Persistent small bowel dilatation.  Bibasilar atelectasis.  Osseous demineralization.  IMPRESSION: Tip of nasogastric tube projects over distal esophagus, recommend advancing tube 10 cm.   Electronically Signed   By: Ulyses SouthwardMark  Boles M.D.   On: 04/21/2014 15:29   Dg Abd Portable 2v  04/22/2014   CLINICAL DATA:  Small bowel obstruction; assess for change  EXAM: PORTABLE ABDOMEN - 2 VIEW  COMPARISON:  Portable abdominal film of April 21, 2014  FINDINGS: There remain loops of moderately distended gas-filled small bowel throughout the abdomen. Very little colonic gas or stool is demonstrated. There is contrast within the urinary bladder. The tip of the nasogastric tube is at the GE junction. The proximal port lies above it. The bony structures exhibit no acute abnormality.  IMPRESSION: Persistent distal small bowel obstructive pattern. Positioning of the nasogastric tube is suboptimal. Advancement of the tube by 10-15 cm is recommended to assure that both the proximal port and tip lie below the GE junction.   Electronically Signed   By: David  [place]   On: 04/22/2014 07:33      ASSESSMENT:  Roosvelt Harps/PLAN:  Afib: rate controlled.  IV heparin for stroke rpevention.  COnservative mgmt for SBO at this time.  Corky CraftsJayadeep S Tniya Bowditch, MD  04/23/2014  12:03  PM

## 2014-04-23 NOTE — Progress Notes (Signed)
Patient ID: Ricky Guzman, male   DOB: Nov 25, 1930, 80 y.o.   MRN: [id number]    Subjective: Pt feels well today.  No complaints.  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 )  Intake/Output from previous day: 11/18 0701 - 11/19 0700 In: 1022.5 [I.V.:902.5; IV Piggyback:120] Out: 2200 [Urine:800; Emesis/NG output:1400] Intake/Output this shift:    PE: Abd: much softer today, less distended, hernia still reducible, +BS, NGT with some output  Lab Results:   Recent Labs  04/22/14 0526 04/23/14 0345  WBC 5.8 7.7  HGB 13.2 12.4*  HCT 39.2 37.7*  PLT 199 198   BMET  Recent Labs  04/22/14 0526 04/23/14 0345  NA 142 145  K 3.5* 3.6*  CL 103 106  CO2 25 26  GLUCOSE 137* 116*  BUN 20 16  CREATININE 0.98 1.11  CALCIUM 8.7 8.4   PT/INR  Recent Labs  04/21/14 1148 04/23/14 0345  LABPROT 16.2* 16.9*  INR 1.29 1.36   CMP     Component Value Date/Time   NA 145 04/23/2014 0345   K 3.6* 04/23/2014 0345   CL 106 04/23/2014 0345   CO2 26 04/23/2014 0345   GLUCOSE 116* 04/23/2014 0345   BUN 16 04/23/2014 0345   CREATININE 1.11 04/23/2014 0345   CALCIUM 8.4 04/23/2014 0345   PROT 6.9 04/22/2014 0526   ALBUMIN 3.0* 04/22/2014 0526   AST 13 04/22/2014 0526   ALT 8 04/22/2014 0526   ALKPHOS 42 04/22/2014 0526   BILITOT 1.4* 04/22/2014 0526   GFRNONAA 60* 04/23/2014 0345   GFRAA 70* 04/23/2014 0345   Lipase     Component Value Date/Time   LIPASE 11 04/21/2014 1148       Studies/Results: Ct Abdomen Pelvis W Contrast  04/21/2014   CLINICAL DATA:  80 year old male with abdominal pain for the past 2 days accompanied by belching.  EXAM: CT ABDOMEN AND PELVIS  WITH CONTRAST  TECHNIQUE: Multidetector CT imaging of the abdomen and pelvis was performed using the standard protocol following bolus administration of intravenous contrast.  CONTRAST:  OMNIPAQUE IOHEXOL 300 MG/ML  SOLN  COMPARISON:  Acute abdominal series radiographs obtained earlier today  FINDINGS: Lower Chest: Trace right pleural effusion. Mild dependent atelectasis bilaterally. Cardiomegaly with biatrial enlargement. No pericardial effusion. Mild circumferential thickening of the visualized distal esophagus. This is nonspecific in appearance. No focal mass. There is a small hiatal hernia.  Abdomen: The stomach is distended and dilated. There is marked distention of the proximal small bowel with a focal transition point in the anterior mid abdomen just posterior to the abdominal fat containing umbilical hernia.  Unremarkable CT appearance of the spleen, pancreas, adrenal glands. Normal hepatic contour and morphology. Unremarkable CT appearance of the spleen, adrenal glands and pancreas. Normal hepatic contour and morphology. Circumscribed hypo attenuating lesions in the left hepatic lobe are most suggestive of benign cysts. The smaller lesions are too small for accurate characterization. Gallbladder is unremarkable. No intra or extrahepatic biliary ductal dilatation.  Unremarkable appearance of the bilateral kidneys. No focal solid lesion, hydronephrosis or nephrolithiasis. 2.3 cm simple cyst in the interpolar left kidney. 7.3 cm simple cyst exophytic from  the lower pole of the left kidney. Additional tiny (less than 1 cm) low-attenuation lesions bilaterally. While too small for accurate characterization, these are also statistically likely simple cysts. No definite enhancing lesion.  Extensive colonic diverticulosis without evidence of active inflammation. Nonspecific mild circumferential thickening of the terminal ileum. There is a small amounts of mesenteric edema and interloop fluid within the  mesentery the affected loops of small bowel. No evidence of free air. Trace free fluid in the left pericolic gutter.  Small right inguinal hernia containing omental fat and a small volume of ascites.  Pelvis: Unremarkable bladder, seminal vesicles and prostate gland.  Bones/Soft Tissues: No acute fracture or aggressive appearing lytic or blastic osseous lesion. Multilevel degenerative disc disease throughout the lumbar spine. Prominent partially calcified disc protrusion at L4-L5 results in narrowing of the central canal.  Vascular: Minimal fusiform ectasia of the infrarenal abdominal aorta which measures only 2.3 cm. Scattered atherosclerotic vascular calcifications without aneurysmal dilatation or focal abnormality.  IMPRESSION: 1. High-grade small bowel obstruction with focal transition point posterior to the omental fat containing umbilical hernia. No definite obstructing mass is identified. Suspected etiology of internal adhesions. If there is no history of prior surgical intervention, internal hernia would also be a consideration. Mild edema and interloop fluid in the affected small bowel mesenteric. No evidence of bowel wall ischemia or perforation. 2. Nonspecific circumferential thickening of the terminal ileum with perhaps trace surrounding inflammatory stranding. Differential considerations include both infectious and inflammatory enteritis including Crohn's disease. If the patient does have a clinical history of Crohn's disease, the bowel obstruction could also be secondary to a region of focal inflammatory stenosis/stricture. 3. Extensive colonic diverticulosis without evidence of active diverticulitis. 4. Circumferential thickening of the distal thoracic esophagus. Recommend clinical correlation for signs and symptoms of gastroesophageal reflux disease. 5. Cardiomegaly with biatrial enlargement. 6. Trace right pleural effusion. 7. Hepatic and renal cysts. 8. Small right inguinal hernia containing  omental fat and small volume ascites. 9. Mild atherosclerotic vascular calcification. 10. Multilevel degenerative disc disease.   Electronically Signed   By: Malachy MoanHeath  McCullough M.D.   On: 04/21/2014 14:02   Dg Chest Port 1 View  04/22/2014   CLINICAL DATA:  Fever.  EXAM: PORTABLE CHEST - 1 VIEW  COMPARISON:  September 13, 2005.  FINDINGS: Stable cardiomediastinal silhouette. No pneumothorax or pleural effusion is noted. Nasogastric tube is seen entering the stomach. Both lungs are clear. The visualized skeletal structures are unremarkable.  IMPRESSION: Nasogastric tube is seen entering stomach. No acute cardiopulmonary abnormality seen.   Electronically Signed   By: Roque LiasJames  Green M.D.   On: 04/22/2014 18:03   Dg Abd 2 Views  04/23/2014   CLINICAL DATA:  Week. Shortness of breath. Small bowel obstruction.  EXAM: ABDOMEN - 2 VIEW  COMPARISON:  04/22/2014  FINDINGS: The tip of the nasogastric tube is just below the GE junction. There has been interval improvement in abnormal small bowel distention with increase in colonic gas. No free intraperitoneal air identified.  IMPRESSION: 1. Improving small bowel obstruction.   Electronically Signed   By: Signa Kellaylor  Stroud M.D.   On: 04/23/2014 09:10   Dg Abd 2 Views  04/21/2014   CLINICAL DATA:  Abdominal pain and bloating  EXAM: ABDOMEN - 2 VIEW  COMPARISON:  None.  FINDINGS: Supine and erect views of the abdomen show markedly distend loops of primarily small bowel measuring up to 5.6 cm in diameter consistent with high-grade small bowel obstruction. Very little colonic bowel gas  is seen. No definite free air is noted on the erect view. CT of the abdomen pelvis may be helpful to determine point of obstruction. No opaque calculi are noted.  IMPRESSION: High-grade small bowel obstruction. Recommend CT of the abdomen pelvis to assess for point of obstruction. No definite free air.   Electronically Signed   By: Dwyane DeePaul  Barry M.D.   On: 04/21/2014 10:59   Dg Abd Portable  1v  04/21/2014   CLINICAL DATA:  Nasogastric tube placement  EXAM: PORTABLE ABDOMEN - 1 VIEW  COMPARISON:  Portable exam 1451 hr compared to 04/21/2014 at 1009 hr  FINDINGS: Tip of nasogastric tube projects over distal esophagus ; recommend advancing tube 10 cm to place proximal side-port within proximal stomach.  Persistent small bowel dilatation.  Bibasilar atelectasis.  Osseous demineralization.  IMPRESSION: Tip of nasogastric tube projects over distal esophagus, recommend advancing tube 10 cm.   Electronically Signed   By: Ulyses SouthwardMark  Boles M.D.   On: 04/21/2014 15:29   Dg Abd Portable 2v  04/22/2014   CLINICAL DATA:  Small bowel obstruction; assess for change  EXAM: PORTABLE ABDOMEN - 2 VIEW  COMPARISON:  Portable abdominal film of April 21, 2014  FINDINGS: There remain loops of moderately distended gas-filled small bowel throughout the abdomen. Very little colonic gas or stool is demonstrated. There is contrast within the urinary bladder. The tip of the nasogastric tube is at the GE junction. The proximal port lies above it. The bony structures exhibit no acute abnormality.  IMPRESSION: Persistent distal small bowel obstructive pattern. Positioning of the nasogastric tube is suboptimal. Advancement of the tube by 10-15 cm is recommended to assure that both the proximal port and tip lie below the GE junction.   Electronically Signed   By: David  SwazilandJordan   On: 04/22/2014 07:33    Anti-infectives: Anti-infectives    None       Assessment/Plan  1. SBO, improving 2. A fib 3. FUO  Plan: 1. Abdominal exam and abdominal xrays show air throughout the colon and less small bowel distention.  We will clamp his NGT today and see how he does. 2. Doubt fever from abdomen as his abdominal exam is benign and improving.   LOS: 2 days    Breonia Kirstein E 04/23/2014, 10:06 AM Pager: [phone]

## 2014-04-23 NOTE — Progress Notes (Signed)
  Echocardiogram 2D Echocardiogram has been performed.  Demauri Advincula 04/23/2014, 8:45 AM

## 2014-04-23 NOTE — Progress Notes (Addendum)
ANTICOAGULATION CONSULT NOTE - Follow Up Consult  Pharmacy Consult for heparin Indication: atrial fibrillation  No Known Allergies  Patient Measurements: Height: 5\' 10"  (177.8 cm) Weight: 208 lb 9.6 oz (94.62 kg) IBW/kg (Calculated) : 73 Heparin Dosing Weight: 80 kg  Vital Signs: Temp: 98.6 F (37 C) (11/19 1352) Temp Source: Oral (11/19 0913) BP: 152/95 mmHg (11/19 1354) Pulse Rate: 70 (11/19 1352)  Labs:  Recent Labs  04/21/14 1148 04/22/14 0526 04/22/14 1930 04/23/14 0345 04/23/14 1411  HGB 14.9 13.2  --  12.4*  --   HCT 43.6 39.2  --  37.7*  --   PLT 195 199  --  198  --   LABPROT 16.2*  --   --  16.9*  --   INR 1.29  --   --  1.36  --   HEPARINUNFRC  --   --   --  <0.10* 0.65  CREATININE 1.09 0.98  --  1.11  --   TROPONINI  --   --  <0.30  --   --     Estimated Creatinine Clearance: 60.2 mL/min (by C-G formula based on Cr of 1.11).   Medications:  Infusions:  . dextrose 5 % and 0.45% NaCl 1,000 mL with potassium chloride 20 mEq infusion 75 mL/hr at 04/23/14 1405  . heparin 1,450 Units/hr (04/23/14 1405)    Assessment: 80 y/o male on IV heparin while warfarin on hold for SBO. Heparin level is therapeutic at 0.65 on 1450 units/hr. No bleeding noted, H/H are low stable, platelets are normal.  Goal of Therapy:  Heparin level 0.3-0.7 units/ml Monitor platelets by anticoagulation protocol: Yes   Plan:  - Continue heparin drip at 1450 units/hr - Confirmatory heparin level at 20:30 - Monitor for s/sx of bleeding  [facility] SurgeryJennifer Michigan Center, Castle DalePharm.D., BCPS Clinical Pharmacist Pager: [phone] 04/23/2014 4:39 PM    Addendum: Confirmatory heparin level is SUPRAtherapeutic at 0.77. No bleeding noted.  Decrease heparin drip to 1350 units/hr Heparin level in the morning  [facility] HospitalJennifer Ethan, 1700 Rainbow BoulevardPharm.D., BCPS Clinical Pharmacist Pager: [phone] 04/23/2014 10:31 PM

## 2014-04-23 NOTE — Plan of Care (Signed)
Problem: Phase I Progression Outcomes Goal: Initial discharge plan identified Outcome: Progressing  Problem: Phase II Progression Outcomes Goal: Discharge plan established Outcome: Progressing Goal: Obtain order to discontinue catheter if appropriate Outcome: Not Applicable Date Met:  86/28/24 Goal: Other Phase II Outcomes/Goals Outcome: Not Applicable Date Met:  17/53/01  Problem: Phase III Progression Outcomes Goal: Voiding independently Outcome: Completed/Met Date Met:  04/23/14

## 2014-04-24 LAB — BASIC METABOLIC PANEL
Anion gap: 14 (ref 5–15)
BUN: 11 mg/dL (ref 6–23)
CHLORIDE: 107 meq/L (ref 96–112)
CO2: 22 mEq/L (ref 19–32)
Calcium: 8 mg/dL — ABNORMAL LOW (ref 8.4–10.5)
Creatinine, Ser: 0.97 mg/dL (ref 0.50–1.35)
GFR calc Af Amer: 87 mL/min — ABNORMAL LOW (ref >90)
GFR calc non Af Amer: 75 mL/min — ABNORMAL LOW (ref >90)
Glucose, Bld: 110 mg/dL — ABNORMAL HIGH (ref 70–99)
POTASSIUM: 3.7 meq/L (ref 3.7–5.3)
SODIUM: 143 meq/L (ref 137–147)

## 2014-04-24 LAB — CBC
HEMATOCRIT: 36.9 % — AB (ref 39.0–52.0)
HEMOGLOBIN: 12.1 g/dL — AB (ref 13.0–17.0)
MCH: 28.5 pg (ref 26.0–34.0)
MCHC: 32.8 g/dL (ref 30.0–36.0)
MCV: 87 fL (ref 78.0–100.0)
Platelets: 212 10*3/uL (ref 150–400)
RBC: 4.24 MIL/uL (ref 4.22–5.81)
RDW: 13.6 % (ref 11.5–15.5)
WBC: 8.7 10*3/uL (ref 4.0–10.5)

## 2014-04-24 LAB — GLUCOSE, CAPILLARY
GLUCOSE-CAPILLARY: 102 mg/dL — AB (ref 70–99)
GLUCOSE-CAPILLARY: 128 mg/dL — AB (ref 70–99)
Glucose-Capillary: 106 mg/dL — ABNORMAL HIGH (ref 70–99)

## 2014-04-24 LAB — HEPARIN LEVEL (UNFRACTIONATED)
HEPARIN UNFRACTIONATED: 0.59 [IU]/mL (ref 0.30–0.70)
Heparin Unfractionated: 0.76 IU/mL — ABNORMAL HIGH (ref 0.30–0.70)

## 2014-04-24 LAB — OCCULT BLOOD X 1 CARD TO LAB, STOOL: FECAL OCCULT BLD: POSITIVE — AB

## 2014-04-24 MED ORDER — WARFARIN SODIUM 5 MG PO TABS
5.0000 mg | ORAL_TABLET | Freq: Once | ORAL | Status: AC
  Administered 2014-04-24: 5 mg via ORAL
  Filled 2014-04-24: qty 1

## 2014-04-24 MED ORDER — FUROSEMIDE 20 MG PO TABS
20.0000 mg | ORAL_TABLET | Freq: Every day | ORAL | Status: DC
  Administered 2014-04-24 – 2014-04-25 (×2): 20 mg via ORAL
  Filled 2014-04-24 (×2): qty 1

## 2014-04-24 MED ORDER — WARFARIN - PHARMACIST DOSING INPATIENT: Freq: Every day | Status: DC

## 2014-04-24 MED ORDER — PANTOPRAZOLE SODIUM 40 MG PO TBEC
40.0000 mg | DELAYED_RELEASE_TABLET | Freq: Every day | ORAL | Status: DC
  Administered 2014-04-25: 40 mg via ORAL

## 2014-04-24 MED ORDER — HEPARIN (PORCINE) IN NACL 100-0.45 UNIT/ML-% IJ SOLN
1250.0000 [IU]/h | INTRAMUSCULAR | Status: DC
  Administered 2014-04-24: 1250 [IU]/h via INTRAVENOUS
  Filled 2014-04-24: qty 250

## 2014-04-24 MED ORDER — TERAZOSIN HCL 5 MG PO CAPS
10.0000 mg | ORAL_CAPSULE | Freq: Every day | ORAL | Status: DC
  Administered 2014-04-24: 10 mg via ORAL
  Filled 2014-04-24 (×2): qty 2

## 2014-04-24 MED ORDER — ACETAMINOPHEN 325 MG PO TABS: 650.0000 mg | ORAL_TABLET | Freq: Four times a day (QID) | ORAL | Status: DC | PRN

## 2014-04-24 MED ORDER — VERAPAMIL HCL ER 240 MG PO TBCR
240.0000 mg | EXTENDED_RELEASE_TABLET | Freq: Every day | ORAL | Status: DC
  Administered 2014-04-25: 240 mg via ORAL
  Filled 2014-04-24: qty 1

## 2014-04-24 NOTE — Progress Notes (Addendum)
ANTICOAGULATION CONSULT NOTE - Follow Up Consult  Pharmacy Consult for heparin Indication: atrial fibrillation  No Known Allergies  Patient Measurements: Height: 5\' 10"  (177.8 cm) Weight: 208 lb 9.6 oz (94.62 kg) IBW/kg (Calculated) : 73 Heparin Dosing Weight: 80 kg  Vital Signs: Temp: 98.9 F (37.2 C) (11/20 0522) Temp Source: Oral (11/20 0522) BP: 145/99 mmHg (11/20 0522) Pulse Rate: 108 (11/20 0522)  Labs:  Recent Labs  04/22/14 0526 04/22/14 1930 04/23/14 0345  04/23/14 2140 04/24/14 0540 04/24/14 1320  HGB 13.2  --  12.4*  --   --  12.1*  --   HCT 39.2  --  37.7*  --   --  36.9*  --   PLT 199  --  198  --   --  212  --   LABPROT  --   --  16.9*  --   --   --   --   INR  --   --  1.36  --   --   --   --   HEPARINUNFRC  --   --  <0.10*  < > 0.77* 0.59 0.76*  CREATININE 0.98  --  1.11  --   --  0.97  --   TROPONINI  --  <0.30  --   --   --   --   --   < > = values in this interval not displayed.  Estimated Creatinine Clearance: 68.9 mL/min (by C-G formula based on Cr of 0.97).   Medications:  Infusions:  . dextrose 5 % and 0.45% NaCl 1,000 mL with potassium chloride 20 mEq infusion 75 mL/hr at 04/24/14 0501  . heparin 1,350 Units/hr (04/24/14 [id number])    Assessment: 80 y/o male on IV heparin while warfarin on hold for SBO. Heparin level came back a little elevated this PM. Will adjust rate.  Goal of Therapy:  Heparin level 0.3-0.7 units/ml Monitor platelets by anticoagulation protocol: Yes   Plan:   Decrease heparin to 1250 units/hr Recheck 8 hr level Monitor for s/sx of bleeding  Ulyses SouthwardMinh Pham, PharmD Pager: [phone] 04/24/2014 1:59 PM    Addendum -Restarting warfarin for AFib -Baseline INR 1.3 -On warfarin PTA: 2.5 mg po Tue/Thurs, 5 mg po all other days -Will give 5 mg po x1 tonight    Agapito GamesAlison Calani Gick, PharmD, BCPS Clinical Pharmacist Pager: [phone] 04/24/2014 4:29 PM

## 2014-04-24 NOTE — Plan of Care (Signed)
Problem: Phase I Progression Outcomes Goal: Initial discharge plan identified Outcome: Completed/Met Date Met:  04/24/14  Problem: Phase II Progression Outcomes Goal: Progress activity as tolerated unless otherwise ordered Outcome: Completed/Met Date Met:  04/24/14  Problem: Phase III Progression Outcomes Goal: Pain controlled on oral analgesia Outcome: Completed/Met Date Met:  04/24/14

## 2014-04-24 NOTE — Progress Notes (Signed)
ANTICOAGULATION CONSULT NOTE - Follow Up Consult  Pharmacy Consult for heparin Indication: atrial fibrillation  Labs:  Recent Labs  04/21/14 1148 04/22/14 0526 04/22/14 1930  04/23/14 0345 04/23/14 1411 04/23/14 2140 04/24/14 0540  HGB 14.9 13.2  --   --  12.4*  --   --  12.1*  HCT 43.6 39.2  --   --  37.7*  --   --  36.9*  PLT 195 199  --   --  198  --   --  212  LABPROT 16.2*  --   --   --  16.9*  --   --   --   INR 1.29  --   --   --  1.36  --   --   --   HEPARINUNFRC  --   --   --   < > <0.10* 0.65 0.77* 0.59  CREATININE 1.09 0.98  --   --  1.11  --   --  0.97  TROPONINI  --   --  <0.30  --   --   --   --   --   < > = values in this interval not displayed.   Assessment/Plan:  80yo male therapeutic on heparin after rate change. Will continue gtt at current rate and confirm stable with additional level.   Vernard GamblesVeronda Kashira Behunin, PharmD, BCPS  04/24/2014,7:05 AM

## 2014-04-24 NOTE — Progress Notes (Signed)
[facility] PROGRESS NOTE  Ricky Guzman ZOX:[id number] DOB: [birth year] DOA: 04/21/2014 PCP: Georgann Housekeeper, MD  Assessment/Plan: SBO -Presented with severe abdominal pain, nausea and vomiting- exam (11/19)- passing flatus, abd soft, non-tender, less distension. -CT abd- high grade SBO secondary to either umbilical hernia, adhesions, or inflammatory stenosis/stricture. No mass, ischemia, or perforation. Additional findings of possible enteritis and GERD; will continue PPI -Repeat abd films (11/19)- improving SBO.   -Surgery on board, rec's- advance diet as toelrated; NGT discontinue on 11/20.   Fever -Temp of 101.2; unclear source -no further episodes of fever -CXR and UA with no acute findings -Final Blood and urine cxs pending but so far no growth  -Tylenol PRN  Preoperative clearance -Surgery rec's- medical/cardiac clearance in case of laparotomy -BNP- elevated 3186  -Troponin x1- negative -2D echo (11/19)- EF 40-45%, severe LVH, diffuse hypokinesis, AV sclerosis, mild MR and RAE -Cardiology rec's-  IV heparin for stroke prevention and resume coumadin now  Umbilical Hernia -Non-tender protuberant abdomen with reducible umbilical hernia -CT abd with hernia as possible etiology of SBO; doubt by surgery  A fib - Rate controlled -IV heparin per pharmacy  -resume home meds -INR normal -will resume coumadin -Continue tele monitoring  Lactic Acidosis -Resolved, with IVF's -normal lactic acid now -Secondary to high grade SBO  Hypokalemia -will continue repletion as needed -Repeat BMET in am  Normocytic anemia -Mild at 12.4 -Likely dilutional, black stools apreciated today; will check FOBT -Monitor CBC in am  HTN -BP stable -will resume home meds  Glaucoma -continue eye drops  Obesity -BMI 30 -low calorie diet and increase activity discussed with patient   DVT Prophylaxis; on heparin; will resume coumadin today Code Status: Full Family Communication: no  family at bedside Disposition Plan: Inpatient   Consultants:  Surgery  Cardiology  Procedures:  2D echocardiogram (11/19)- - LVEF40-45%, severe LVH, diffuse hypokinesis with more prounounced inferior hypokinesis, mild MR, mild RAE, upper normal LA size, aortic valve sclerosis with trivial AI  Antibiotics:  None  Subjective: States much better today. Passing flatus and has had two BM. Tolerating CLD. Patient denies nausea or vomiting.     Objective: Filed Vitals:   04/24/14 1441  BP: 164/97  Pulse: 84  Temp: 98.1 F (36.7 C)  Resp: 20    Intake/Output Summary (Last 24 hours) at 04/24/14 1550 Last data filed at 04/24/14 1432  Gross per 24 hour  Intake 2066.25 ml  Output    325 ml  Net 1741.25 ml   Filed Weights   04/21/14 1847  Weight: 94.62 kg (208 lb 9.6 oz)    Exam:  Gen: Alert AA male in NAD.  Appears comfortable in bed.  NGT discontinue and tolerating CLD HEENT: Normocephalic, atraumatic.  Pupils symmertrical.  Dry mucosa Chest: clear to auscultate bilaterally, no ronchi or rales  Cardiac: ast rate with irregular rhythm. S1-S2, no rubs murmurs or gallops  Abdomen: soft, non tender, non distended, +bowel sounds. No guarding or rigidity  Extremities: Symmetrical in appearance without cyanosis or edema  Neurological: Alert awake oriented to time place and person.  Psychiatric: Appears normal.   Data Reviewed: Basic Metabolic Panel:  Recent Labs Lab 04/21/14 1148 04/22/14 0526 04/23/14 0345 04/24/14 0540  NA 139 142 145 143  K 3.4* 3.5* 3.6* 3.7  CL 96 103 106 107  CO2 25 25 26 22   GLUCOSE 142* 137* 116* 110*  BUN 19 20 16 11   CREATININE 1.09 0.98 1.11 0.97  CALCIUM 9.8 8.7 8.4 8.0*  Liver Function Tests:  Recent Labs Lab 04/21/14 1148 04/22/14 0526  AST 17 13  ALT 10 8  ALKPHOS 59 42  BILITOT 1.8* 1.4*  PROT 8.1 6.9  ALBUMIN 3.7 3.0*    Recent Labs Lab 04/21/14 1148  LIPASE 11   CBC:  Recent Labs Lab 04/21/14 1148  04/22/14 0526 04/23/14 0345 04/24/14 0540  WBC 9.1 5.8 7.7 8.7  NEUTROABS 7.6  --   --   --   HGB 14.9 13.2 12.4* 12.1*  HCT 43.6 39.2 37.7* 36.9*  MCV 86.0 84.3 85.5 87.0  PLT 195 199 198 212   Cardiac Enzymes:  Recent Labs Lab 04/22/14 1930  TROPONINI <0.30   BNP (last 3 results)  Recent Labs  04/22/14 2110  PROBNP 3186.0*   CBG:  Recent Labs Lab 04/23/14 1209 04/23/14 1803 04/24/14 04/24/14 0619 04/24/14 1151  GLUCAP 136* 107* 102* 106* 128*    Recent Results (from the past 240 hour(s))  Culture, blood (routine x 2)     Status: None (Preliminary result)   Collection Time: 04/23/14 12:20 PM  Result Value Ref Range Status   Specimen Description BLOOD LEFT ARM  Final   Special Requests BOTTLES DRAWN AEROBIC ONLY 10 CC  Final   Culture  Setup Time   Final    04/23/2014 17:10 [facility]    Culture   Final           BLOOD CULTURE RECEIVED NO GROWTH TO DATE CULTURE WILL BE HELD FOR 5 DAYS BEFORE ISSUING A FINAL NEGATIVE REPORT Performed at [facility]    Report Status PENDING  Incomplete  Culture, blood (routine x 2)     Status: None (Preliminary result)   Collection Time: 04/23/14 12:34 PM  Result Value Ref Range Status   Specimen Description BLOOD LEFT HAND  Final   Special Requests BOTTLES DRAWN AEROBIC ONLY 4CC  Final   Culture  Setup Time   Final    04/23/2014 17:10 Performed at [facility]    Culture   Final           BLOOD CULTURE RECEIVED NO GROWTH TO DATE CULTURE WILL BE HELD FOR 5 DAYS BEFORE ISSUING A FINAL NEGATIVE REPORT Performed at [facility]    Report Status PENDING  Incomplete     Studies: Dg Chest Port 1 View  04/22/2014   CLINICAL DATA:  Fever.  EXAM: PORTABLE CHEST - 1 VIEW  COMPARISON:  September 13, 2005.  FINDINGS: Stable cardiomediastinal silhouette. No pneumothorax or pleural effusion is noted. Nasogastric tube is seen entering the stomach. Both lungs are clear. The visualized  skeletal structures are unremarkable.  IMPRESSION: Nasogastric tube is seen entering stomach. No acute cardiopulmonary abnormality seen.   Electronically Signed   By: Roque LiasJames  Green M.D.   On: 04/22/2014 18:03   Dg Abd 2 Views  04/23/2014   CLINICAL DATA:  Week. Shortness of breath. Small bowel obstruction.  EXAM: ABDOMEN - 2 VIEW  COMPARISON:  04/22/2014  FINDINGS: The tip of the nasogastric tube is just below the GE junction. There has been interval improvement in abnormal small bowel distention with increase in colonic gas. No free intraperitoneal air identified.  IMPRESSION: 1. Improving small bowel obstruction.   Electronically Signed   By: Signa Kellaylor  Stroud M.D.   On: 04/23/2014 09:10    Scheduled Meds: . antiseptic oral rinse  7 mL Mouth Rinse q12n4p  . chlorhexidine  15 mL Mouth Rinse BID  .  latanoprost  1 drop Both Eyes QHS  . pantoprazole (PROTONIX) IV  40 mg Intravenous Q24H  . sodium chloride  3 mL Intravenous Q12H   Continuous Infusions: . dextrose 5 % and 0.45% NaCl 1,000 mL with potassium chloride 20 mEq infusion 75 mL/hr at 04/24/14 1504  . heparin 1,250 Units/hr (04/24/14 1402)    Principal Problem:   SBO (small bowel obstruction) Active Problems:   Essential hypertension   Obesity (BMI 30-39.9)   Glaucoma   Atrial fibrillation, chronic   Lactic acid acidosis   Long term current use of anticoagulant therapy   Fever   Hypokalemia   Chronic atrial fibrillation   Time spent: 30 minutes   Vassie LollMadera, Almir Botts MD [facility] Pager [phone]. If 7PM-7AM, please contact night-coverage at [email or web address], password [email or web address] 04/24/2014, 3:50 PM  LOS: 3 days

## 2014-04-24 NOTE — Progress Notes (Signed)
SUBJECTIVE:  Resting comfortably  OBJECTIVE:   Vitals:   Filed Vitals:   04/23/14 1354 04/23/14 2258 04/24/14 0522 04/24/14 1441  BP: 152/95 140/76 145/99 164/97  Pulse:  96 108 84  Temp:  98.7 F (37.1 C) 98.9 F (37.2 C) 98.1 F (36.7 C)  TempSrc:  Oral Oral   Resp:  20 22 20   Height:      Weight:      SpO2:  99% 100% 100%   I&O's:    Intake/Output Summary (Last 24 hours) at 04/24/14 1519 Last data filed at 04/24/14 1432  Gross per 24 hour  Intake 2066.25 ml  Output    325 ml  Net 1741.25 ml   TELEMETRY: Reviewed telemetry pt in AFib. Borderline rate control:     PHYSICAL EXAM General: Well developed, well nourished, in no acute distress Head:   Normal cephalic and atramatic  Lungs:  No wheezing Heart:  Irregularly irregular       LABS: Basic Metabolic Panel:  Recent Labs  09/81/1911/19/15 0345 04/24/14 0540  NA 145 143  K 3.6* 3.7  CL 106 107  CO2 26 22  GLUCOSE 116* 110*  BUN 16 11  CREATININE 1.11 0.97  CALCIUM 8.4 8.0*   Liver Function Tests:  Recent Labs  04/22/14 0526  AST 13  ALT 8  ALKPHOS 42  BILITOT 1.4*  PROT 6.9  ALBUMIN 3.0*   No results for input(s): LIPASE, AMYLASE in the last 72 hours. CBC:  Recent Labs  04/23/14 0345 04/24/14 0540  WBC 7.7 8.7  HGB 12.4* 12.1*  HCT 37.7* 36.9*  MCV 85.5 87.0  PLT 198 212   Cardiac Enzymes:  Recent Labs  04/22/14 1930  TROPONINI <0.30   BNP: Invalid input(s): POCBNP D-Dimer: No results for input(s): DDIMER in the last 72 hours. Hemoglobin A1C: No results for input(s): HGBA1C in the last 72 hours. Fasting Lipid Panel: No results for input(s): CHOL, HDL, LDLCALC, TRIG, CHOLHDL, LDLDIRECT in the last 72 hours. Thyroid Function Tests: No results for input(s): TSH, T4TOTAL, T3FREE, THYROIDAB in the last 72 hours.  Invalid input(s): FREET3 Anemia Panel: No results for input(s): VITAMINB12, FOLATE, FERRITIN, TIBC, IRON, RETICCTPCT in the last 72 hours. Coag Panel:   Lab  Results  Component Value Date   INR 1.36 04/23/2014   INR 1.29 04/21/2014   INR 1.78* 06/18/2012    RADIOLOGY: Ct Abdomen Pelvis W Contrast  04/21/2014   CLINICAL DATA:  80 year old male with abdominal pain for the past 2 days accompanied by belching.  EXAM: CT ABDOMEN AND PELVIS WITH CONTRAST  TECHNIQUE: Multidetector CT imaging of the abdomen and pelvis was performed using the standard protocol following bolus administration of intravenous contrast.  CONTRAST:  100mL OMNIPAQUE IOHEXOL 300 MG/ML  SOLN  COMPARISON:  Acute abdominal series radiographs obtained earlier today  FINDINGS: Lower Chest: Trace right pleural effusion. Mild dependent atelectasis bilaterally. Cardiomegaly with biatrial enlargement. No pericardial effusion. Mild circumferential thickening of the visualized distal esophagus. This is nonspecific in appearance. No focal mass. There is a small hiatal hernia.  Abdomen: The stomach is distended and dilated. There is marked distention of the proximal small bowel with a focal transition point in the anterior mid abdomen just posterior to the abdominal fat containing umbilical hernia.  Unremarkable CT appearance of the spleen, pancreas, adrenal glands. Normal hepatic contour and morphology. Unremarkable CT appearance of the spleen, adrenal glands and pancreas. Normal hepatic contour and morphology. Circumscribed hypo attenuating lesions in the left  hepatic lobe are most suggestive of benign cysts. The smaller lesions are too small for accurate characterization. Gallbladder is unremarkable. No intra or extrahepatic biliary ductal dilatation.  Unremarkable appearance of the bilateral kidneys. No focal solid lesion, hydronephrosis or nephrolithiasis. 2.3 cm simple cyst in the interpolar left kidney. 7.3 cm simple cyst exophytic from the lower pole of the left kidney. Additional tiny (less than 1 cm) low-attenuation lesions bilaterally. While too small for accurate characterization, these are also  statistically likely simple cysts. No definite enhancing lesion.  Extensive colonic diverticulosis without evidence of active inflammation. Nonspecific mild circumferential thickening of the terminal ileum. There is a small amounts of mesenteric edema and interloop fluid within the mesentery the affected loops of small bowel. No evidence of free air. Trace free fluid in the left pericolic gutter.  Small right inguinal hernia containing omental fat and a small volume of ascites.  Pelvis: Unremarkable bladder, seminal vesicles and prostate gland.  Bones/Soft Tissues: No acute fracture or aggressive appearing lytic or blastic osseous lesion. Multilevel degenerative disc disease throughout the lumbar spine. Prominent partially calcified disc protrusion at L4-L5 results in narrowing of the central canal.  Vascular: Minimal fusiform ectasia of the infrarenal abdominal aorta which measures only 2.3 cm. Scattered atherosclerotic vascular calcifications without aneurysmal dilatation or focal abnormality.  IMPRESSION: 1. High-grade small bowel obstruction with focal transition point posterior to the omental fat containing umbilical hernia. No definite obstructing mass is identified. Suspected etiology of internal adhesions. If there is no history of prior surgical intervention, internal hernia would also be a consideration. Mild edema and interloop fluid in the affected small bowel mesenteric. No evidence of bowel wall ischemia or perforation. 2. Nonspecific circumferential thickening of the terminal ileum with perhaps trace surrounding inflammatory stranding. Differential considerations include both infectious and inflammatory enteritis including Crohn's disease. If the patient does have a clinical history of Crohn's disease, the bowel obstruction could also be secondary to a region of focal inflammatory stenosis/stricture. 3. Extensive colonic diverticulosis without evidence of active diverticulitis. 4. Circumferential  thickening of the distal thoracic esophagus. Recommend clinical correlation for signs and symptoms of gastroesophageal reflux disease. 5. Cardiomegaly with biatrial enlargement. 6. Trace right pleural effusion. 7. Hepatic and renal cysts. 8. Small right inguinal hernia containing omental fat and small volume ascites. 9. Mild atherosclerotic vascular calcification. 10. Multilevel degenerative disc disease.   Electronically Signed   By: Malachy MoanHeath  McCullough M.D.   On: 04/21/2014 14:02   Dg Chest Port 1 View  04/22/2014   CLINICAL DATA:  Fever.  EXAM: PORTABLE CHEST - 1 VIEW  COMPARISON:  September 13, 2005.  FINDINGS: Stable cardiomediastinal silhouette. No pneumothorax or pleural effusion is noted. Nasogastric tube is seen entering the stomach. Both lungs are clear. The visualized skeletal structures are unremarkable.  IMPRESSION: Nasogastric tube is seen entering stomach. No acute cardiopulmonary abnormality seen.   Electronically Signed   By: Roque LiasJames  Green M.D.   On: 04/22/2014 18:03   Dg Abd 2 Views  04/23/2014   CLINICAL DATA:  Week. Shortness of breath. Small bowel obstruction.  EXAM: ABDOMEN - 2 VIEW  COMPARISON:  04/22/2014  FINDINGS: The tip of the nasogastric tube is just below the GE junction. There has been interval improvement in abnormal small bowel distention with increase in colonic gas. No free intraperitoneal air identified.  IMPRESSION: 1. Improving small bowel obstruction.   Electronically Signed   By: Signa Kellaylor  Stroud M.D.   On: 04/23/2014 09:10   Dg Abd 2 Views  04/21/2014   CLINICAL DATA:  Abdominal pain and bloating  EXAM: ABDOMEN - 2 VIEW  COMPARISON:  None.  FINDINGS: Supine and erect views of the abdomen show markedly distend loops of primarily small bowel measuring up to 5.6 cm in diameter consistent with high-grade small bowel obstruction. Very little colonic bowel gas is seen. No definite free air is noted on the erect view. CT of the abdomen pelvis may be helpful to determine point of  obstruction. No opaque calculi are noted.  IMPRESSION: High-grade small bowel obstruction. Recommend CT of the abdomen pelvis to assess for point of obstruction. No definite free air.   Electronically Signed   By: Dwyane Dee M.D.   On: 04/21/2014 10:59   Dg Abd Portable 1v  04/21/2014   CLINICAL DATA:  Nasogastric tube placement  EXAM: PORTABLE ABDOMEN - 1 VIEW  COMPARISON:  Portable exam 1451 hr compared to 04/21/2014 at 1009 hr  FINDINGS: Tip of nasogastric tube projects over distal esophagus ; recommend advancing tube 10 cm to place proximal side-port within proximal stomach.  Persistent small bowel dilatation.  Bibasilar atelectasis.  Osseous demineralization.  IMPRESSION: Tip of nasogastric tube projects over distal esophagus, recommend advancing tube 10 cm.   Electronically Signed   By: Ulyses Southward M.D.   On: 04/21/2014 15:29   Dg Abd Portable 2v  04/22/2014   CLINICAL DATA:  Small bowel obstruction; assess for change  EXAM: PORTABLE ABDOMEN - 2 VIEW  COMPARISON:  Portable abdominal film of April 21, 2014  FINDINGS: There remain loops of moderately distended gas-filled small bowel throughout the abdomen. Very little colonic gas or stool is demonstrated. There is contrast within the urinary bladder. The tip of the nasogastric tube is at the GE junction. The proximal port lies above it. The bony structures exhibit no acute abnormality.  IMPRESSION: Persistent distal small bowel obstructive pattern. Positioning of the nasogastric tube is suboptimal. Advancement of the tube by 10-15 cm is recommended to assure that both the proximal port and tip lie below the GE junction.   Electronically Signed   By: David  [place]   On: 04/22/2014 07:33      ASSESSMENT:  Ricky Guzman:  Afib: rate controlled.  IV heparin for stroke prevention; Coumadin restarting today.  COnservative mgmt for SBO at this time-improving.  Corky Crafts, MD  04/24/2014  3:19 PM

## 2014-04-24 NOTE — Progress Notes (Signed)
LOS: 3 days   Subjective: Doing quite well today. No pain. NGT clamped and no issues with N/V. Reports two bowel movements this morning.   Objective: Vital signs in last 24 hours:afebrile Temp:  [98.6 F (37 C)-98.9 F (37.2 C)] 98.9 F (37.2 C) (11/20 0522) Pulse Rate:  [70-108] 108 (11/20 0522) Resp:  [20-24] 22 (11/20 0522) BP: (140-158)/(76-107) 145/99 mmHg (11/20 0522) SpO2:  [99 %-100 %] 100 % (11/20 0522) Last BM Date: 04/24/14   Laboratory  CBC  Recent Labs  04/23/14 0345 04/24/14 0540  WBC 7.7 8.7  HGB 12.4* 12.1*  HCT 37.7* 36.9*  PLT 198 212   BMET  Recent Labs  04/23/14 0345 04/24/14 0540  NA 145 143  K 3.6* 3.7  CL 106 107  CO2 26 22  GLUCOSE 116* 110*  BUN 16 11  CREATININE 1.11 0.97  CALCIUM 8.4 8.0*     Physical Exam General appearance: alert, cooperative, appears stated age, no distress and moderately obese Resp: clear to auscultation bilaterally Cardio: irregularly irregular rhythm GI: NGT (clamped) with minimal distension, soft, NABS, no focal areas of tenderness, soft NT umbilical hernia   Assessment: 1.SBO, clinically resolved 2.Chronic Atrial fibrillation 3. FUO, resolved  Plan: 1. D/C NGT today and begin clears. Encourage ambulation.  2. FUO, seems to have resolved. WBC count normal.    Ardis RowanPRESSON, Angelene Rome LEE, [facility] Pager [phone] 04/24/2014

## 2014-04-25 LAB — BASIC METABOLIC PANEL
Anion gap: 15 (ref 5–15)
BUN: 8 mg/dL (ref 6–23)
CHLORIDE: 105 meq/L (ref 96–112)
CO2: 22 mEq/L (ref 19–32)
Calcium: 8.3 mg/dL — ABNORMAL LOW (ref 8.4–10.5)
Creatinine, Ser: 1.08 mg/dL (ref 0.50–1.35)
GFR, EST AFRICAN AMERICAN: 72 mL/min — AB (ref >90)
GFR, EST NON AFRICAN AMERICAN: 62 mL/min — AB (ref >90)
Glucose, Bld: 95 mg/dL (ref 70–99)
Potassium: 3.9 mEq/L (ref 3.7–5.3)
SODIUM: 142 meq/L (ref 137–147)

## 2014-04-25 LAB — PROTIME-INR
INR: 1.37 (ref 0.00–1.49)
Prothrombin Time: 17 seconds — ABNORMAL HIGH (ref 11.6–15.2)

## 2014-04-25 MED ORDER — WARFARIN SODIUM 5 MG PO TABS
5.0000 mg | ORAL_TABLET | Freq: Once | ORAL | Status: DC
  Filled 2014-04-25: qty 1

## 2014-04-25 MED ORDER — PANTOPRAZOLE SODIUM 40 MG PO TBEC: 40.0000 mg | DELAYED_RELEASE_TABLET | Freq: Every day | ORAL | Status: AC

## 2014-04-25 MED ORDER — POLYETHYLENE GLYCOL 3350 17 G PO PACK: 17.0000 g | PACK | Freq: Every day | ORAL | Status: AC

## 2014-04-25 MED ORDER — WARFARIN VIDEO: Freq: Once | Status: DC

## 2014-04-25 NOTE — Plan of Care (Signed)
Problem: Phase II Progression Outcomes Goal: IV changed to normal saline lock Outcome: Completed/Met Date Met:  04/25/14

## 2014-04-25 NOTE — Plan of Care (Signed)
Problem: Phase II Progression Outcomes Goal: Vital signs remain stable Outcome: Completed/Met Date Met:  04/25/14

## 2014-04-25 NOTE — Progress Notes (Signed)
DC home with family, pt and family verbally understood DC INSTRUCTIONS, NO QUESTIONS ASKED

## 2014-04-25 NOTE — Progress Notes (Signed)
Subjective:  No SOB or chest pain  Objective:  Vital Signs in the last 24 hours: BP 142/87 mmHg  Pulse 84  Temp(Src) 99.9 F (37.7 C) (Oral)  Resp 20  Ht 5\' 10"  (1.778 m)  Wt 94.62 kg (208 lb 9.6 oz)  BMI 29.93 kg/m2  SpO2 99%  Physical Exam: Pleasant BM in NAD Lungs:  Clear  Cardiac:  irregular rhythm, normal S1 and S2, no S3 Extremities:  No edema present  Intake/Output from previous day: 11/20 0701 - 11/21 0700 In: 1310.5 [P.O.:800; I.V.:510.5] Out: -  Weight Filed Weights   04/21/14 1847  Weight: 94.62 kg (208 lb 9.6 oz)    Lab Results: Basic Metabolic Panel:  Recent Labs  40/98/1111/20/15 0540 04/25/14 0659  NA 143 142  K 3.7 3.9  CL 107 105  CO2 22 22  GLUCOSE 110* 95  BUN 11 8  CREATININE 0.97 1.08    CBC:  Recent Labs  04/23/14 0345 04/24/14 0540  WBC 7.7 8.7  HGB 12.4* 12.1*  HCT 37.7* 36.9*  MCV 85.5 87.0  PLT 198 212    BNP    Component Value Date/Time   PROBNP 3186.0* 04/22/2014 2110    PROTIME: Lab Results  Component Value Date   INR 1.37 04/25/2014   INR 1.36 04/23/2014   INR 1.29 04/21/2014    Telemetry: Atrial fib with controlled response  Assessment/Plan:  1. Chronic atrial fibrillation controlled 2. Chronic diastolic CHF controlled 3. Anticoagulation with warfarin restarted 4. Recent SBO  Rec:  Home meds started.  A fib controlled.      Darden PalmerW. Spencer Tilley, Jr.  MD [facility]  04/25/2014, 11:42 AM

## 2014-04-25 NOTE — Progress Notes (Signed)
  Subjective: Multiple BMs  Objective: Vital signs in last 24 hours: Temp:  [98.1 F (36.7 C)-99.9 F (37.7 C)] 99.9 F (37.7 C) (11/21 0510) Pulse Rate:  [84-104] 84 (11/21 0510) Resp:  [18-20] 20 (11/21 0510) BP: (142-164)/(87-97) 142/87 mmHg (11/21 0510) SpO2:  [99 %-100 %] 99 % (11/21 0510) Last BM Date: 04/24/14  Intake/Output from previous day: 11/20 0701 - 11/21 0700 In: 1310.5 [P.O.:800; I.V.:510.5] Out: -  Intake/Output this shift: Total I/O In: 360 [P.O.:360] Out: 200 [Urine:200]  General appearance: alert and cooperative Resp: clear to auscultation bilaterally GI: soft, UH reduces, NT  Lab Results:   Recent Labs  04/23/14 0345 04/24/14 0540  WBC 7.7 8.7  HGB 12.4* 12.1*  HCT 37.7* 36.9*  PLT 198 212   BMET  Recent Labs  04/24/14 0540 04/25/14 0659  NA 143 142  K 3.7 3.9  CL 107 105  CO2 22 22  GLUCOSE 110* 95  BUN 11 8  CREATININE 0.97 1.08  CALCIUM 8.0* 8.3*   PT/INR  Recent Labs  04/23/14 0345 04/25/14 0659  LABPROT 16.9* 17.0*  INR 1.36 1.37   ABG No results for input(s): PHART, HCO3 in the last 72 hours.  Invalid input(s): PCO2, PO2  Studies/Results: No results found.  Anti-infectives: Anti-infectives    None      Assessment/Plan: SBO - resolving, advance diet   LOS: 4 days    Donoven Pett E 04/25/2014

## 2014-04-25 NOTE — Progress Notes (Signed)
ANTICOAGULATION CONSULT NOTE - Follow Up Consult  Pharmacy Consult for warfarin Indication: atrial fibrillation  No Known Allergies  Patient Measurements: Height: 5\' 10"  (177.8 cm) Weight: 208 lb 9.6 oz (94.62 kg) IBW/kg (Calculated) : 73  Vital Signs: Temp: 99.9 F (37.7 C) (11/21 0510) Temp Source: Oral (11/21 0510) BP: 142/87 mmHg (11/21 0510) Pulse Rate: 84 (11/21 0510)  Labs:  Recent Labs  04/22/14 1930 04/23/14 0345  04/23/14 2140 04/24/14 0540 04/24/14 1320 04/25/14 0659  HGB  --  12.4*  --   --  12.1*  --   --   HCT  --  37.7*  --   --  36.9*  --   --   PLT  --  198  --   --  212  --   --   LABPROT  --  16.9*  --   --   --   --  17.0*  INR  --  1.36  --   --   --   --  1.37  HEPARINUNFRC  --  <0.10*  < > 0.77* 0.59 0.76*  --   CREATININE  --  1.11  --   --  0.97  --  1.08  TROPONINI <0.30  --   --   --   --   --   --   < > = values in this interval not displayed.  Estimated Creatinine Clearance: 61.9 mL/min (by C-G formula based on Cr of 1.08).   Medications:  Scheduled:  . furosemide  20 mg Oral Daily  . latanoprost  1 drop Both Eyes QHS  . pantoprazole  40 mg Oral Q1200  . sodium chloride  3 mL Intravenous Q12H  . terazosin  10 mg Oral QHS  . verapamil  240 mg Oral Daily  . Warfarin - Pharmacist Dosing Inpatient   Does not apply q1800    Assessment: 80 yo M with history of afib. On chronic warfarin PTA. Warfarin held because patient was NPO due to SBO. Pharmacy consulted to restart warfarin. Patient's INR subtherapeutic this morning at 1.37. Hgb/Hct stable, PLTC wnl. No bleeding noted.   Goal of Therapy:  INR 2-3 Monitor platelets by anticoagulation protocol: Yes   Plan:  Warfarin 5mg  PO x 1 tonight Daily INR Monitor s/sx of bleeding  Thank you for allowing pharmacy to be part of this patient's care team  Kiyomi Pallo M. Andi Mahaffy, Pharm.D Clinical Pharmacy Resident Pager: [phone] 04/25/2014 .11:17 AM

## 2014-04-25 NOTE — Plan of Care (Signed)
Problem: Phase I Progression Outcomes Goal: Hemodynamically stable Outcome: Completed/Met Date Met:  04/25/14     

## 2014-04-25 NOTE — Discharge Summary (Signed)
Physician Discharge Summary  Ricky Guzman ZOX:[id number] DOB: [birth year] DOA: 04/21/2014  PCP: Georgann Housekeeper, MD  Admit date: 04/21/2014 Discharge date: 04/25/2014  Time spent: >30 minutes  Recommendations for Outpatient Follow-up:  Check BMET to follow electrolytes and renal function Check CBC to follow Hgb levels  Discharge Diagnoses:  Principal Problem:   SBO (small bowel obstruction) Active Problems:   Essential hypertension   Obesity (BMI 30-39.9)   Glaucoma   Atrial fibrillation, chronic   Lactic acid acidosis   Long term current use of anticoagulant therapy   Fever   Hypokalemia   Chronic atrial fibrillation   Discharge Condition: stable and improved. Discharge home with family care and follow up with PCP in 10 days recommended.   Diet recommendation: low sodium and low calorie diet  Filed Weights   04/21/14 1847  Weight: 94.62 kg (208 lb 9.6 oz)    History of present illness:  80 y.o. male With past medical history of atrial fibrillation on chronic Coumadin and hypertension plus umbilical hernia who started having severe abdominal pain starting 2 days prior to day of admission. Patient attributed this to eating some greasy food the day before onset of symptoms.  Went to see his PCP due to abd pain and nausea; patient was sent over by his PCP to the emergency room for further evaluation and a CT scan done noted a high-grade small bowel obstruction with focal transition point posterior to the omental fat containing umbilical hernia but no definitive obstructing mass identified. No evidence of bowel wall ischemia or perforation was noted, however lactic acid level was done noted to mildly elevated at 2.8.  Hospital Course:  SBO -Presented with severe abdominal pain, nausea and vomiting. abd x-ray exam (11/19) demonstrated resolution of SBO pattern; patient was also passing flatus and had a BM. atthat moment NGT placed on admisison was removed and patient initiated on  CLD. After 24 hours of tolerating diet and continue availability of passing BM's, arranges were made for patient to be discharge -patient advise to follow a low residue diet and to start using miralax -advise to maintain good hydration   Fever, transient -Temp of 101.2; unclear source -no further episodes of fever for over 48 hours -CXR and UA with no acute findings -Final Blood and urine cxs pending but so far no growth  -Tylenol was used as needed  Preoperative clearance -Surgery rec's- medical/cardiac clearance in case of laparotomy -BNP- elevated 3186  -Troponin x1- negative -2D echo (11/19)- EF 40-45%, severe LVH, diffuse hypokinesis, AV sclerosis, mild MR and RAE -Cardiology rec's- to follow low sodium diet, be compliant with medications, check daily weight and continue use of B-blockers, ACE and diuretic regimen.  Umbilical Hernia -Non-tender protuberant abdomen with reducible umbilical hernia -CT abd with hernia as possible etiology of SBO; doubt by surgery  A fib - Rate controlled -resume home meds for rate control -INR normal -will resume coumadin  Lactic Acidosis -Resolved, with IVF's -normal lactic acid at discharge -Secondary to high grade SBO  Hypokalemia -will continue repletion as needed -Repeat BMET during follow up appointment  Normocytic anemia -Mild drop of Hgb at 12.1' most likely dilutional due to IVF's -Monitor CBC during follow up appointment  HTN -BP stable -will resume home meds  Glaucoma -continue eye drops  Obesity -BMI 30 -low calorie diet and increase activity discussed with patient  Procedures:  2D echocardiogram (11/19)- - LVEF40-45%, severe LVH, diffuse hypokinesis with more prounounced inferior hypokinesis, mild MR, mild RAE, upper normal LA  size, aortic valve sclerosis with trivial AI  Consultations:  General surgery  Cardiology   Discharge Exam: Filed Vitals:   04/25/14 1312  BP: 125/70  Pulse: 69  Temp:  98.8 F (37.1 C)  Resp: 24   Gen: Alert AA male in NAD. Appears comfortable in bed. tolerating low residue diet at discharge. No fever HEENT: Normocephalic, atraumatic. Pupils symmertrical. Dry mucosa Chest: clear to auscultate bilaterally, no ronchi or rales  Cardiac: ast rate with irregular rhythm. S1-S2, no rubs murmurs or gallops  Abdomen: soft, non tender, non distended, +bowel sounds. No guarding or rigidity  Extremities: Symmetrical in appearance without cyanosis or edema  Neurological: Alert awake oriented to time place and person.  Psychiatric: Appears normal.   Discharge Instructions You were cared for by a hospitalist during your hospital stay. If you have any questions about your discharge medications or the care you received while you were in the hospital after you are discharged, you can call the unit and asked to speak with the hospitalist on call if the hospitalist that took care of you is not available. Once you are discharged, your primary care physician will handle any further medical issues. Please note that NO REFILLS for any discharge medications will be authorized once you are discharged, as it is imperative that you return to your primary care physician (or establish a relationship with a primary care physician if you do not have one) for your aftercare needs so that they can reassess your need for medications and monitor your lab values.  Discharge Instructions    Diet - low sodium heart healthy    Complete by:  As directed      Discharge instructions    Complete by:  As directed   Take medications as prescribed Arrange follow up with PCP in 10 days Follow a low residue diet Maintain good hydration Use miralax as discussed, to keep stools on safe side Watch your sodium intake (less than 2 grams daily)          Current Discharge Medication List    START taking these medications   Details  pantoprazole (PROTONIX) 40 MG tablet Take 1 tablet (40 mg  total) by mouth daily at 12 noon. Qty: 30 tablet, Refills: 1    polyethylene glycol (MIRALAX / GLYCOLAX) packet Take 17 g by mouth daily. Hold for diarrhea. Qty: 28 each, Refills: 1      CONTINUE these medications which have NOT CHANGED   Details  benazepril (LOTENSIN) 20 MG tablet Take 20 mg by mouth daily.    cholecalciferol (VITAMIN D) 1000 UNITS tablet Take 1,000 Units by mouth daily.    furosemide (LASIX) 20 MG tablet Take 20 mg by mouth 2 (two) times daily.     LUMIGAN 0.01 % SOLN Place 1 drop into both eyes at bedtime.     terazosin (HYTRIN) 10 MG capsule Take 10 mg by mouth at bedtime.    verapamil (CALAN-SR) 240 MG CR tablet Take 240 mg by mouth 2 (two) times daily.     warfarin (COUMADIN) 5 MG tablet Take 2.5-5 mg by mouth daily. 2.5 mg on tue, thu and 5 mg all other days       No Known Allergies Follow-up Information    Follow up with Georgann Housekeeper, MD. Schedule an appointment as soon as possible for a visit in 10 days.   Specialty:  Internal Medicine   Contact information:   [address], [address] [place] [address] [phone]  The results of significant diagnostics from this hospitalization (including imaging, microbiology, ancillary and laboratory) are listed below for reference.    Significant Diagnostic Studies: Ct Abdomen Pelvis W Contrast  04/21/2014   CLINICAL DATA:  80 year old male with abdominal pain for the past 2 days accompanied by belching.  EXAM: CT ABDOMEN AND PELVIS WITH CONTRAST  TECHNIQUE: Multidetector CT imaging of the abdomen and pelvis was performed using the standard protocol following bolus administration of intravenous contrast.  CONTRAST:  100mL OMNIPAQUE IOHEXOL 300 MG/ML  SOLN  COMPARISON:  Acute abdominal series radiographs obtained earlier today  FINDINGS: Lower Chest: Trace right pleural effusion. Mild dependent atelectasis bilaterally. Cardiomegaly with biatrial enlargement. No pericardial effusion. Mild  circumferential thickening of the visualized distal esophagus. This is nonspecific in appearance. No focal mass. There is a small hiatal hernia.  Abdomen: The stomach is distended and dilated. There is marked distention of the proximal small bowel with a focal transition point in the anterior mid abdomen just posterior to the abdominal fat containing umbilical hernia.  Unremarkable CT appearance of the spleen, pancreas, adrenal glands. Normal hepatic contour and morphology. Unremarkable CT appearance of the spleen, adrenal glands and pancreas. Normal hepatic contour and morphology. Circumscribed hypo attenuating lesions in the left hepatic lobe are most suggestive of benign cysts. The smaller lesions are too small for accurate characterization. Gallbladder is unremarkable. No intra or extrahepatic biliary ductal dilatation.  Unremarkable appearance of the bilateral kidneys. No focal solid lesion, hydronephrosis or nephrolithiasis. 2.3 cm simple cyst in the interpolar left kidney. 7.3 cm simple cyst exophytic from the lower pole of the left kidney. Additional tiny (less than 1 cm) low-attenuation lesions bilaterally. While too small for accurate characterization, these are also statistically likely simple cysts. No definite enhancing lesion.  Extensive colonic diverticulosis without evidence of active inflammation. Nonspecific mild circumferential thickening of the terminal ileum. There is a small amounts of mesenteric edema and interloop fluid within the mesentery the affected loops of small bowel. No evidence of free air. Trace free fluid in the left pericolic gutter.  Small right inguinal hernia containing omental fat and a small volume of ascites.  Pelvis: Unremarkable bladder, seminal vesicles and prostate gland.  Bones/Soft Tissues: No acute fracture or aggressive appearing lytic or blastic osseous lesion. Multilevel degenerative disc disease throughout the lumbar spine. Prominent partially calcified disc  protrusion at L4-L5 results in narrowing of the central canal.  Vascular: Minimal fusiform ectasia of the infrarenal abdominal aorta which measures only 2.3 cm. Scattered atherosclerotic vascular calcifications without aneurysmal dilatation or focal abnormality.  IMPRESSION: 1. High-grade small bowel obstruction with focal transition point posterior to the omental fat containing umbilical hernia. No definite obstructing mass is identified. Suspected etiology of internal adhesions. If there is no history of prior surgical intervention, internal hernia would also be a consideration. Mild edema and interloop fluid in the affected small bowel mesenteric. No evidence of bowel wall ischemia or perforation. 2. Nonspecific circumferential thickening of the terminal ileum with perhaps trace surrounding inflammatory stranding. Differential considerations include both infectious and inflammatory enteritis including Crohn's disease. If the patient does have a clinical history of Crohn's disease, the bowel obstruction could also be secondary to a region of focal inflammatory stenosis/stricture. 3. Extensive colonic diverticulosis without evidence of active diverticulitis. 4. Circumferential thickening of the distal thoracic esophagus. Recommend clinical correlation for signs and symptoms of gastroesophageal reflux disease. 5. Cardiomegaly with biatrial enlargement. 6. Trace right pleural effusion. 7. Hepatic and renal cysts. 8. Small right  inguinal hernia containing omental fat and small volume ascites. 9. Mild atherosclerotic vascular calcification. 10. Multilevel degenerative disc disease.   Electronically Signed   By: Malachy MoanHeath  McCullough M.D.   On: 04/21/2014 14:02   Dg Chest Port 1 View  04/22/2014   CLINICAL DATA:  Fever.  EXAM: PORTABLE CHEST - 1 VIEW  COMPARISON:  September 13, 2005.  FINDINGS: Stable cardiomediastinal silhouette. No pneumothorax or pleural effusion is noted. Nasogastric tube is seen entering the stomach.  Both lungs are clear. The visualized skeletal structures are unremarkable.  IMPRESSION: Nasogastric tube is seen entering stomach. No acute cardiopulmonary abnormality seen.   Electronically Signed   By: Roque LiasJames  Green M.D.   On: 04/22/2014 18:03   Dg Abd 2 Views  04/23/2014   CLINICAL DATA:  Week. Shortness of breath. Small bowel obstruction.  EXAM: ABDOMEN - 2 VIEW  COMPARISON:  04/22/2014  FINDINGS: The tip of the nasogastric tube is just below the GE junction. There has been interval improvement in abnormal small bowel distention with increase in colonic gas. No free intraperitoneal air identified.  IMPRESSION: 1. Improving small bowel obstruction.   Electronically Signed   By: Signa Kellaylor  Stroud M.D.   On: 04/23/2014 09:10   Dg Abd 2 Views  04/21/2014   CLINICAL DATA:  Abdominal pain and bloating  EXAM: ABDOMEN - 2 VIEW  COMPARISON:  None.  FINDINGS: Supine and erect views of the abdomen show markedly distend loops of primarily small bowel measuring up to 5.6 cm in diameter consistent with high-grade small bowel obstruction. Very little colonic bowel gas is seen. No definite free air is noted on the erect view. CT of the abdomen pelvis may be helpful to determine point of obstruction. No opaque calculi are noted.  IMPRESSION: High-grade small bowel obstruction. Recommend CT of the abdomen pelvis to assess for point of obstruction. No definite free air.   Electronically Signed   By: Dwyane DeePaul  Barry M.D.   On: 04/21/2014 10:59   Dg Abd Portable 1v  04/21/2014   CLINICAL DATA:  Nasogastric tube placement  EXAM: PORTABLE ABDOMEN - 1 VIEW  COMPARISON:  Portable exam 1451 hr compared to 04/21/2014 at 1009 hr  FINDINGS: Tip of nasogastric tube projects over distal esophagus ; recommend advancing tube 10 cm to place proximal side-port within proximal stomach.  Persistent small bowel dilatation.  Bibasilar atelectasis.  Osseous demineralization.  IMPRESSION: Tip of nasogastric tube projects over distal esophagus,  recommend advancing tube 10 cm.   Electronically Signed   By: Ulyses SouthwardMark  Boles M.D.   On: 04/21/2014 15:29   Dg Abd Portable 2v  04/22/2014   CLINICAL DATA:  Small bowel obstruction; assess for change  EXAM: PORTABLE ABDOMEN - 2 VIEW  COMPARISON:  Portable abdominal film of April 21, 2014  FINDINGS: There remain loops of moderately distended gas-filled small bowel throughout the abdomen. Very little colonic gas or stool is demonstrated. There is contrast within the urinary bladder. The tip of the nasogastric tube is at the GE junction. The proximal port lies above it. The bony structures exhibit no acute abnormality.  IMPRESSION: Persistent distal small bowel obstructive pattern. Positioning of the nasogastric tube is suboptimal. Advancement of the tube by 10-15 cm is recommended to assure that both the proximal port and tip lie below the GE junction.   Electronically Signed   By: David  SwazilandJordan   On: 04/22/2014 07:33    Microbiology: Recent Results (from the past 240 hour(s))  Culture, blood (routine x 2)  Status: None (Preliminary result)   Collection Time: 04/23/14 12:20 PM  Result Value Ref Range Status   Specimen Description BLOOD LEFT ARM  Final   Special Requests BOTTLES DRAWN AEROBIC ONLY 10 CC  Final   Culture  Setup Time   Final    04/23/2014 17:10 Performed at [facility]    Culture   Final           BLOOD CULTURE RECEIVED NO GROWTH TO DATE CULTURE WILL BE HELD FOR 5 DAYS BEFORE ISSUING A FINAL NEGATIVE REPORT Performed at [facility]    Report Status PENDING  Incomplete  Culture, blood (routine x 2)     Status: None (Preliminary result)   Collection Time: 04/23/14 12:34 PM  Result Value Ref Range Status   Specimen Description BLOOD LEFT HAND  Final   Special Requests BOTTLES DRAWN AEROBIC ONLY 4CC  Final   Culture  Setup Time   Final    04/23/2014 17:10 Performed at [facility]    Culture   Final           BLOOD CULTURE RECEIVED NO GROWTH TO  DATE CULTURE WILL BE HELD FOR 5 DAYS BEFORE ISSUING A FINAL NEGATIVE REPORT Performed at [facility]    Report Status PENDING  Incomplete     Labs: Basic Metabolic Panel:  Recent Labs Lab 04/21/14 1148 04/22/14 0526 04/23/14 0345 04/24/14 0540 04/25/14 0659  NA 139 142 145 143 142  K 3.4* 3.5* 3.6* 3.7 3.9  CL 96 103 106 107 105  CO2 25 25 26 22 22   GLUCOSE 142* 137* 116* 110* 95  BUN 19 20 16 11 8   CREATININE 1.09 0.98 1.11 0.97 1.08  CALCIUM 9.8 8.7 8.4 8.0* 8.3*   Liver Function Tests:  Recent Labs Lab 04/21/14 1148 04/22/14 0526  AST 17 13  ALT 10 8  ALKPHOS 59 42  BILITOT 1.8* 1.4*  PROT 8.1 6.9  ALBUMIN 3.7 3.0*    Recent Labs Lab 04/21/14 1148  LIPASE 11   CBC:  Recent Labs Lab 04/21/14 1148 04/22/14 0526 04/23/14 0345 04/24/14 0540  WBC 9.1 5.8 7.7 8.7  NEUTROABS 7.6  --   --   --   HGB 14.9 13.2 12.4* 12.1*  HCT 43.6 39.2 37.7* 36.9*  MCV 86.0 84.3 85.5 87.0  PLT 195 199 198 212   Cardiac Enzymes:  Recent Labs Lab 04/22/14 1930  TROPONINI <0.30   BNP: BNP (last 3 results)  Recent Labs  04/22/14 2110  PROBNP 3186.0*   CBG:  Recent Labs Lab 04/23/14 1209 04/23/14 1803 04/24/14 04/24/14 0619 04/24/14 1151  GLUCAP 136* 107* 102* 106* 128*    Signed:  Vassie Loll  [facility] 04/25/2014, 2:54 PM

## 2014-04-25 NOTE — Plan of Care (Signed)
Problem: Phase III Progression Outcomes Goal: IV/normal saline lock discontinued Outcome: Completed/Met Date Met:  04/25/14     

## 2014-04-29 LAB — CULTURE, BLOOD (ROUTINE X 2)
CULTURE: NO GROWTH
CULTURE: NO GROWTH

## 2016-03-10 ENCOUNTER — Other Ambulatory Visit: Payer: Self-pay | Admitting: Nurse Practitioner

## 2016-03-10 DIAGNOSIS — G63 Polyneuropathy in diseases classified elsewhere: Secondary | ICD-10-CM

## 2016-03-21 ENCOUNTER — Ambulatory Visit (INDEPENDENT_AMBULATORY_CARE_PROVIDER_SITE_OTHER): Payer: Medicare Other | Admitting: Podiatry

## 2016-03-21 ENCOUNTER — Other Ambulatory Visit: Payer: Self-pay | Admitting: *Deleted

## 2016-03-21 ENCOUNTER — Encounter: Payer: Self-pay | Admitting: Podiatry

## 2016-03-21 DIAGNOSIS — B351 Tinea unguium: Secondary | ICD-10-CM

## 2016-03-21 DIAGNOSIS — M79609 Pain in unspecified limb: Secondary | ICD-10-CM | POA: Diagnosis not present

## 2016-03-21 NOTE — Progress Notes (Signed)
   Subjective:    Patient ID: Ricky Guzman, male    DOB: 02/08/1931, 82 y.o.   MRN: [id number]  HPI: He presents today with chief complaint of painful elongated toenails and he would also like to have his feet looked at. He states that all in all he is doing very good that he has some dry skin between his toes.    Review of Systems  HENT: Positive for hearing loss.   Cardiovascular: Positive for leg swelling.  All other systems reviewed and are negative.      Objective:   Physical Exam: Vital signs are stable alert and oriented 3. Pulses are palpable. Neurologic sensorium is slightly diminished per Semmes-Weinstein monofilament to the tightness of the toes bilateral. Vibratory sensation is intact as his proprioception. Deep tendon reflexes are intact and muscle strength is normal and symmetrical bilateral. Orthopedic evaluation demonstrates all joints distal to the ankle range of motion without crepitation. Cutaneous evaluation demonstrates supple hydrated cutis dorsum of the foot interdigital lateral aspect of the foot does appear to be slightly xerotic. His toenails are thick yellow dystrophic onychomycotic.        Assessment & Plan:  Assessment: Some neuropathy bilaterally. Pain in limb secondary to onychomycosis.  Plan: Debridement of all reactive hyperkeratosis bilateral. Follow up with him on an as-needed basis.

## 2016-03-22 ENCOUNTER — Other Ambulatory Visit: Payer: Self-pay

## 2016-03-30 ENCOUNTER — Other Ambulatory Visit: Payer: Self-pay

## 2016-04-06 ENCOUNTER — Ambulatory Visit
Admission: RE | Admit: 2016-04-06 | Discharge: 2016-04-06 | Disposition: A | Discharge status: Home / Self Care | Payer: Medicare Other | Source: Ambulatory Visit | Attending: Nurse Practitioner | Admitting: Nurse Practitioner

## 2016-04-06 DIAGNOSIS — G63 Polyneuropathy in diseases classified elsewhere: Secondary | ICD-10-CM

## 2016-06-20 ENCOUNTER — Ambulatory Visit: Payer: Medicare Other | Admitting: Podiatry

## 2016-07-04 ENCOUNTER — Ambulatory Visit (INDEPENDENT_AMBULATORY_CARE_PROVIDER_SITE_OTHER): Payer: Medicare Other | Admitting: Podiatry

## 2016-07-04 DIAGNOSIS — B351 Tinea unguium: Secondary | ICD-10-CM

## 2016-07-04 DIAGNOSIS — M79609 Pain in unspecified limb: Secondary | ICD-10-CM

## 2016-07-04 NOTE — Progress Notes (Signed)
Complaint:  Visit Type: Patient returns to my office for continued preventative foot care services. Complaint: Patient states" my nails have grown long and thick and become painful to walk and wear shoes" Patient has been diagnosed with DM with no foot complications. The patient presents for preventative foot care services. No changes to ROS  Podiatric Exam: Vascular: dorsalis pedis and posterior tibial pulses are palpable bilateral. Capillary return is immediate. Temperature gradient is WNL. Skin turgor WNL  Sensorium: Diminished  Semmes Weinstein monofilament test. Normal tactile sensation bilaterally. Nail Exam: Pt has thick disfigured discolored nails with subungual debris noted bilateral entire nail hallux through fifth toenails Ulcer Exam: There is no evidence of ulcer or pre-ulcerative changes or infection. Orthopedic Exam: Muscle tone and strength are WNL. No limitations in general ROM. No crepitus or effusions noted. Foot type and digits show no abnormalities. Bony prominences are unremarkable. Skin: No Porokeratosis. No infection or ulcers  Diagnosis:  Onychomycosis, , Pain in right toe, pain in left toes  Treatment & Plan Procedures and Treatment: Consent by patient was obtained for treatment procedures. The patient understood the discussion of treatment and procedures well. All questions were answered thoroughly reviewed. Debridement of mycotic and hypertrophic toenails, 1 through 5 bilateral and clearing of subungual debris. No ulceration, no infection noted.  Return Visit-Office Procedure: Patient instructed to return to the office for a follow up visit 3 months for continued evaluation and treatment.    Jahrel Borthwick DPM 

## 2016-09-29 ENCOUNTER — Encounter (HOSPITAL_COMMUNITY): Payer: Self-pay

## 2016-09-29 ENCOUNTER — Inpatient Hospital Stay (HOSPITAL_COMMUNITY): Payer: Medicare Other

## 2016-09-29 ENCOUNTER — Inpatient Hospital Stay (HOSPITAL_COMMUNITY)
Admission: EM | Admit: 2016-09-29 | Discharge: 2016-11-03 | DRG: 064 | Disposition: E | Payer: Medicare Other | Attending: Neurology | Admitting: Neurology

## 2016-09-29 ENCOUNTER — Emergency Department (HOSPITAL_COMMUNITY): Payer: Medicare Other

## 2016-09-29 ENCOUNTER — Emergency Department (HOSPITAL_COMMUNITY): Payer: Medicare Other | Admitting: Certified Registered"

## 2016-09-29 ENCOUNTER — Encounter (HOSPITAL_COMMUNITY): Admission: EM | Disposition: E | Payer: Self-pay | Source: Home / Self Care | Attending: Neurology

## 2016-09-29 DIAGNOSIS — G92 Toxic encephalopathy: Secondary | ICD-10-CM | POA: Diagnosis present

## 2016-09-29 DIAGNOSIS — I5022 Chronic systolic (congestive) heart failure: Secondary | ICD-10-CM | POA: Diagnosis present

## 2016-09-29 DIAGNOSIS — Z833 Family history of diabetes mellitus: Secondary | ICD-10-CM

## 2016-09-29 DIAGNOSIS — R29716 NIHSS score 16: Secondary | ICD-10-CM | POA: Diagnosis present

## 2016-09-29 DIAGNOSIS — Z66 Do not resuscitate: Secondary | ICD-10-CM | POA: Diagnosis present

## 2016-09-29 DIAGNOSIS — I110 Hypertensive heart disease with heart failure: Secondary | ICD-10-CM | POA: Diagnosis present

## 2016-09-29 DIAGNOSIS — I482 Chronic atrial fibrillation, unspecified: Secondary | ICD-10-CM | POA: Diagnosis present

## 2016-09-29 DIAGNOSIS — Z9189 Other specified personal risk factors, not elsewhere classified: Secondary | ICD-10-CM

## 2016-09-29 DIAGNOSIS — Z789 Other specified health status: Secondary | ICD-10-CM

## 2016-09-29 DIAGNOSIS — I63412 Cerebral infarction due to embolism of left middle cerebral artery: Secondary | ICD-10-CM | POA: Diagnosis present

## 2016-09-29 DIAGNOSIS — E785 Hyperlipidemia, unspecified: Secondary | ICD-10-CM | POA: Diagnosis present

## 2016-09-29 DIAGNOSIS — R4701 Aphasia: Secondary | ICD-10-CM | POA: Diagnosis present

## 2016-09-29 DIAGNOSIS — Z9289 Personal history of other medical treatment: Secondary | ICD-10-CM

## 2016-09-29 DIAGNOSIS — I63312 Cerebral infarction due to thrombosis of left middle cerebral artery: Secondary | ICD-10-CM

## 2016-09-29 DIAGNOSIS — I10 Essential (primary) hypertension: Secondary | ICD-10-CM | POA: Diagnosis not present

## 2016-09-29 DIAGNOSIS — I340 Nonrheumatic mitral (valve) insufficiency: Secondary | ICD-10-CM | POA: Diagnosis not present

## 2016-09-29 DIAGNOSIS — R29810 Facial weakness: Secondary | ICD-10-CM | POA: Diagnosis present

## 2016-09-29 DIAGNOSIS — I771 Stricture of artery: Secondary | ICD-10-CM | POA: Diagnosis present

## 2016-09-29 DIAGNOSIS — G936 Cerebral edema: Secondary | ICD-10-CM | POA: Diagnosis present

## 2016-09-29 DIAGNOSIS — G8191 Hemiplegia, unspecified affecting right dominant side: Secondary | ICD-10-CM | POA: Diagnosis present

## 2016-09-29 DIAGNOSIS — J9600 Acute respiratory failure, unspecified whether with hypoxia or hypercapnia: Secondary | ICD-10-CM | POA: Diagnosis not present

## 2016-09-29 DIAGNOSIS — J9601 Acute respiratory failure with hypoxia: Secondary | ICD-10-CM | POA: Diagnosis present

## 2016-09-29 DIAGNOSIS — I639 Cerebral infarction, unspecified: Secondary | ICD-10-CM | POA: Diagnosis present

## 2016-09-29 DIAGNOSIS — E669 Obesity, unspecified: Secondary | ICD-10-CM | POA: Diagnosis present

## 2016-09-29 DIAGNOSIS — Z9911 Dependence on respirator [ventilator] status: Secondary | ICD-10-CM

## 2016-09-29 DIAGNOSIS — I63512 Cerebral infarction due to unspecified occlusion or stenosis of left middle cerebral artery: Secondary | ICD-10-CM | POA: Diagnosis not present

## 2016-09-29 DIAGNOSIS — Z01818 Encounter for other preprocedural examination: Secondary | ICD-10-CM

## 2016-09-29 DIAGNOSIS — Z4659 Encounter for fitting and adjustment of other gastrointestinal appliance and device: Secondary | ICD-10-CM

## 2016-09-29 DIAGNOSIS — Z6832 Body mass index (BMI) 32.0-32.9, adult: Secondary | ICD-10-CM | POA: Diagnosis not present

## 2016-09-29 DIAGNOSIS — G9340 Encephalopathy, unspecified: Secondary | ICD-10-CM | POA: Diagnosis not present

## 2016-09-29 DIAGNOSIS — Z7189 Other specified counseling: Secondary | ICD-10-CM

## 2016-09-29 DIAGNOSIS — Z7901 Long term (current) use of anticoagulants: Secondary | ICD-10-CM | POA: Diagnosis not present

## 2016-09-29 DIAGNOSIS — N179 Acute kidney failure, unspecified: Secondary | ICD-10-CM | POA: Diagnosis present

## 2016-09-29 DIAGNOSIS — Z978 Presence of other specified devices: Secondary | ICD-10-CM

## 2016-09-29 DIAGNOSIS — J9690 Respiratory failure, unspecified, unspecified whether with hypoxia or hypercapnia: Secondary | ICD-10-CM

## 2016-09-29 DIAGNOSIS — I6789 Other cerebrovascular disease: Secondary | ICD-10-CM | POA: Diagnosis not present

## 2016-09-29 DIAGNOSIS — I6300 Cerebral infarction due to thrombosis of unspecified precerebral artery: Secondary | ICD-10-CM | POA: Diagnosis not present

## 2016-09-29 DIAGNOSIS — T45516A Underdosing of anticoagulants, initial encounter: Secondary | ICD-10-CM | POA: Diagnosis present

## 2016-09-29 DIAGNOSIS — D72829 Elevated white blood cell count, unspecified: Secondary | ICD-10-CM | POA: Diagnosis present

## 2016-09-29 DIAGNOSIS — E876 Hypokalemia: Secondary | ICD-10-CM | POA: Diagnosis present

## 2016-09-29 DIAGNOSIS — Z515 Encounter for palliative care: Secondary | ICD-10-CM | POA: Diagnosis present

## 2016-09-29 HISTORY — PX: IR PERCUTANEOUS ART THROMBECTOMY/INFUSION INTRACRANIAL INC DIAG ANGIO: IMG6087

## 2016-09-29 HISTORY — PX: RADIOLOGY WITH ANESTHESIA: SHX6223

## 2016-09-29 HISTORY — PX: IR US GUIDE VASC ACCESS LEFT: IMG2389

## 2016-09-29 LAB — COMPREHENSIVE METABOLIC PANEL
ALBUMIN: 3.9 g/dL (ref 3.5–5.0)
ALK PHOS: 61 U/L (ref 38–126)
ALT: 10 U/L — AB (ref 17–63)
AST: 30 U/L (ref 15–41)
Anion gap: 15 (ref 5–15)
BILIRUBIN TOTAL: 0.6 mg/dL (ref 0.3–1.2)
BUN: 11 mg/dL (ref 6–20)
CALCIUM: 8.8 mg/dL — AB (ref 8.9–10.3)
CO2: 19 mmol/L — ABNORMAL LOW (ref 22–32)
CREATININE: 1.44 mg/dL — AB (ref 0.61–1.24)
Chloride: 108 mmol/L (ref 101–111)
GFR calc Af Amer: 50 mL/min — ABNORMAL LOW (ref >60)
GFR, EST NON AFRICAN AMERICAN: 43 mL/min — AB (ref >60)
Glucose, Bld: 121 mg/dL — ABNORMAL HIGH (ref 65–99)
POTASSIUM: 3.3 mmol/L — AB (ref 3.5–5.1)
Sodium: 142 mmol/L (ref 135–145)
TOTAL PROTEIN: 6.9 g/dL (ref 6.5–8.1)

## 2016-09-29 LAB — BASIC METABOLIC PANEL
Anion gap: 9 (ref 5–15)
BUN: 6 mg/dL (ref 6–20)
CHLORIDE: 109 mmol/L (ref 101–111)
CO2: 22 mmol/L (ref 22–32)
Calcium: 7.9 mg/dL — ABNORMAL LOW (ref 8.9–10.3)
Creatinine, Ser: 0.89 mg/dL (ref 0.61–1.24)
GFR calc Af Amer: >60 mL/min (ref >60)
GFR calc non Af Amer: >60 mL/min (ref >60)
Glucose, Bld: 120 mg/dL — ABNORMAL HIGH (ref 65–99)
POTASSIUM: 3.4 mmol/L — AB (ref 3.5–5.1)
Sodium: 140 mmol/L (ref 135–145)

## 2016-09-29 LAB — BLOOD GAS, ARTERIAL
Acid-base deficit: 2.5 mmol/L — ABNORMAL HIGH (ref 0.0–2.0)
Bicarbonate: 20.6 mmol/L (ref 20.0–28.0)
Drawn by: 441351
FIO2: 40
O2 Saturation: 99.2 %
PATIENT TEMPERATURE: 98.6
PCO2 ART: 28.5 mmHg — AB (ref 32.0–48.0)
PEEP: 5 cmH2O
RATE: 16 resp/min
VT: 510 mL
pH, Arterial: 7.473 — ABNORMAL HIGH (ref 7.350–7.450)
pO2, Arterial: 162 mmHg — ABNORMAL HIGH (ref 83.0–108.0)

## 2016-09-29 LAB — MAGNESIUM: Magnesium: 1.9 mg/dL (ref 1.7–2.4)

## 2016-09-29 LAB — CBC WITH DIFFERENTIAL/PLATELET
Basophils Absolute: 0 10*3/uL (ref 0.0–0.1)
Basophils Relative: 0 %
EOS ABS: 0.1 10*3/uL (ref 0.0–0.7)
EOS PCT: 1 %
HCT: 40.4 % (ref 39.0–52.0)
HEMOGLOBIN: 13.2 g/dL (ref 13.0–17.0)
LYMPHS ABS: 1.4 10*3/uL (ref 0.7–4.0)
LYMPHS PCT: 11 %
MCH: 29.2 pg (ref 26.0–34.0)
MCHC: 32.7 g/dL (ref 30.0–36.0)
MCV: 89.4 fL (ref 78.0–100.0)
MONOS PCT: 7 %
Monocytes Absolute: 0.8 10*3/uL (ref 0.1–1.0)
Neutro Abs: 10.4 10*3/uL — ABNORMAL HIGH (ref 1.7–7.7)
Neutrophils Relative %: 81 %
PLATELETS: 184 10*3/uL (ref 150–400)
RBC: 4.52 MIL/uL (ref 4.22–5.81)
RDW: 14.3 % (ref 11.5–15.5)
WBC: 12.7 10*3/uL — AB (ref 4.0–10.5)

## 2016-09-29 LAB — I-STAT CHEM 8, ED
BUN: 12 mg/dL (ref 6–20)
CHLORIDE: 106 mmol/L (ref 101–111)
CREATININE: 1.3 mg/dL — AB (ref 0.61–1.24)
Calcium, Ion: 1.1 mmol/L — ABNORMAL LOW (ref 1.15–1.40)
Glucose, Bld: 126 mg/dL — ABNORMAL HIGH (ref 65–99)
HEMATOCRIT: 41 % (ref 39.0–52.0)
Hemoglobin: 13.9 g/dL (ref 13.0–17.0)
POTASSIUM: 3.2 mmol/L — AB (ref 3.5–5.1)
SODIUM: 144 mmol/L (ref 135–145)
TCO2: 23 mmol/L (ref 0–100)

## 2016-09-29 LAB — I-STAT TROPONIN, ED: TROPONIN I, POC: 0.18 ng/mL — AB (ref 0.00–0.08)

## 2016-09-29 LAB — LIPID PANEL
CHOL/HDL RATIO: 4.2 ratio
Cholesterol: 137 mg/dL (ref 0–200)
HDL: 33 mg/dL — AB (ref >40)
LDL Cholesterol: 80 mg/dL (ref 0–99)
Triglycerides: 118 mg/dL (ref <150)
VLDL: 24 mg/dL (ref 0–40)

## 2016-09-29 LAB — TROPONIN I
Troponin I: 0.18 ng/mL (ref <0.03)
Troponin I: 0.21 ng/mL (ref <0.03)

## 2016-09-29 LAB — PROTIME-INR
INR: 1.18
PROTHROMBIN TIME: 15.1 s (ref 11.4–15.2)

## 2016-09-29 LAB — PHOSPHORUS: PHOSPHORUS: 1.6 mg/dL — AB (ref 2.5–4.6)

## 2016-09-29 LAB — APTT: aPTT: 32 seconds (ref 24–36)

## 2016-09-29 LAB — GLUCOSE, CAPILLARY: Glucose-Capillary: 119 mg/dL — ABNORMAL HIGH (ref 65–99)

## 2016-09-29 LAB — MRSA PCR SCREENING: MRSA BY PCR: NEGATIVE

## 2016-09-29 SURGERY — RADIOLOGY WITH ANESTHESIA
Anesthesia: General

## 2016-09-29 MED ORDER — FENTANYL CITRATE (PF) 100 MCG/2ML IJ SOLN
50.0000 ug | INTRAMUSCULAR | Status: AC | PRN
  Administered 2016-09-30 (×3): 50 ug via INTRAVENOUS
  Filled 2016-09-29: qty 2

## 2016-09-29 MED ORDER — ACETAMINOPHEN 650 MG RE SUPP: 650.0000 mg | RECTAL | Status: DC | PRN

## 2016-09-29 MED ORDER — ACETAMINOPHEN 325 MG PO TABS: 650.0000 mg | ORAL_TABLET | ORAL | Status: DC | PRN

## 2016-09-29 MED ORDER — PROPOFOL 1000 MG/100ML IV EMUL
0.0000 ug/kg/min | INTRAVENOUS | Status: DC
  Administered 2016-09-29: 25 ug/kg/min via INTRAVENOUS
  Administered 2016-09-29: 30 ug/kg/min via INTRAVENOUS
  Administered 2016-09-30: 15 ug/kg/min via INTRAVENOUS
  Administered 2016-09-30: 40 ug/kg/min via INTRAVENOUS
  Administered 2016-09-30: 43.908 ug/kg/min via INTRAVENOUS
  Administered 2016-10-01: 20 ug/kg/min via INTRAVENOUS
  Filled 2016-09-29 (×8): qty 100

## 2016-09-29 MED ORDER — TIMOLOL MALEATE 0.5 % OP SOLN
1.0000 [drp] | Freq: Every day | OPHTHALMIC | Status: DC
  Administered 2016-09-29 – 2016-10-03 (×5): 1 [drp] via OPHTHALMIC
  Filled 2016-09-29: qty 5

## 2016-09-29 MED ORDER — ROCURONIUM BROMIDE 100 MG/10ML IV SOLN
INTRAVENOUS | Status: DC | PRN
  Administered 2016-09-29 (×2): 50 mg via INTRAVENOUS

## 2016-09-29 MED ORDER — LATANOPROST 0.005 % OP SOLN: 1.0000 [drp] | Freq: Every day | OPHTHALMIC | Status: DC

## 2016-09-29 MED ORDER — POTASSIUM CHLORIDE 10 MEQ/100ML IV SOLN
10.0000 meq | INTRAVENOUS | Status: DC
  Filled 2016-09-29 (×3): qty 100

## 2016-09-29 MED ORDER — ASPIRIN 300 MG RE SUPP
300.0000 mg | Freq: Every day | RECTAL | Status: DC
  Administered 2016-09-29 – 2016-10-02 (×4): 300 mg via RECTAL
  Filled 2016-09-29 (×4): qty 1

## 2016-09-29 MED ORDER — ORAL CARE MOUTH RINSE
15.0000 mL | OROMUCOSAL | Status: DC
  Administered 2016-09-29: 15 mL via OROMUCOSAL

## 2016-09-29 MED ORDER — SODIUM CHLORIDE 0.9 % IV SOLN
30.0000 meq | Freq: Once | INTRAVENOUS | Status: DC
  Filled 2016-09-29: qty 15

## 2016-09-29 MED ORDER — ACETAMINOPHEN 160 MG/5ML PO SOLN
650.0000 mg | ORAL | Status: DC | PRN
  Administered 2016-10-01: 650 mg
  Filled 2016-09-29: qty 20.3

## 2016-09-29 MED ORDER — POTASSIUM CHLORIDE 20 MEQ/15ML (10%) PO SOLN
40.0000 meq | Freq: Once | ORAL | Status: AC
  Administered 2016-09-29: 40 meq
  Filled 2016-09-29: qty 30

## 2016-09-29 MED ORDER — SODIUM CHLORIDE 0.9 % IV SOLN
INTRAVENOUS | Status: DC
  Administered 2016-09-29 – 2016-10-01 (×4): via INTRAVENOUS

## 2016-09-29 MED ORDER — PHENYLEPHRINE HCL 10 MG/ML IJ SOLN
INTRAVENOUS | Status: DC | PRN
  Administered 2016-09-29: 30 ug/min via INTRAVENOUS

## 2016-09-29 MED ORDER — LABETALOL HCL 5 MG/ML IV SOLN
10.0000 mg | Freq: Once | INTRAVENOUS | Status: AC
  Administered 2016-09-29: 10 mg via INTRAVENOUS
  Filled 2016-09-29: qty 4

## 2016-09-29 MED ORDER — SUCCINYLCHOLINE 20MG/ML (10ML) SYRINGE FOR MEDFUSION PUMP - OPTIME
INTRAMUSCULAR | Status: DC | PRN
  Administered 2016-09-29: 140 mg via INTRAVENOUS

## 2016-09-29 MED ORDER — FENTANYL CITRATE (PF) 100 MCG/2ML IJ SOLN
50.0000 ug | INTRAMUSCULAR | Status: DC | PRN
  Administered 2016-09-30 – 2016-10-01 (×6): 50 ug via INTRAVENOUS
  Filled 2016-09-29 (×7): qty 2

## 2016-09-29 MED ORDER — STROKE: EARLY STAGES OF RECOVERY BOOK
Freq: Once | Status: DC
  Filled 2016-09-29: qty 1

## 2016-09-29 MED ORDER — CLEVIDIPINE BUTYRATE 0.5 MG/ML IV EMUL
0.0000 mg/h | INTRAVENOUS | Status: DC
  Administered 2016-09-29: 10 mg/h via INTRAVENOUS
  Administered 2016-09-29: 2 mg/h via INTRAVENOUS
  Administered 2016-09-29: 5 mg/h via INTRAVENOUS
  Administered 2016-09-29: 2 mg/h via INTRAVENOUS
  Administered 2016-09-30: 5 mg/h via INTRAVENOUS
  Administered 2016-09-30: 10 mg/h via INTRAVENOUS
  Administered 2016-09-30: 2.5 mg/h via INTRAVENOUS
  Administered 2016-10-01 (×2): 4 mg/h via INTRAVENOUS
  Filled 2016-09-29 (×8): qty 50

## 2016-09-29 MED ORDER — POLYETHYLENE GLYCOL 3350 17 G PO PACK: 17.0000 g | PACK | Freq: Every day | ORAL | Status: DC

## 2016-09-29 MED ORDER — LIDOCAINE HCL (CARDIAC) 20 MG/ML IV SOLN
INTRAVENOUS | Status: DC | PRN
  Administered 2016-09-29: 80 mg via INTRAVENOUS

## 2016-09-29 MED ORDER — TERAZOSIN HCL 5 MG PO CAPS: 10.0000 mg | ORAL_CAPSULE | Freq: Every day | ORAL | Status: DC

## 2016-09-29 MED ORDER — EPTIFIBATIDE 20 MG/10ML IV SOLN
INTRAVENOUS | Status: AC
  Filled 2016-09-29: qty 10

## 2016-09-29 MED ORDER — SODIUM CHLORIDE 0.9 % IV SOLN
INTRAVENOUS | Status: DC | PRN
  Administered 2016-09-29 (×2): via INTRAVENOUS

## 2016-09-29 MED ORDER — CHLORHEXIDINE GLUCONATE 0.12% ORAL RINSE (MEDLINE KIT)
15.0000 mL | Freq: Two times a day (BID) | OROMUCOSAL | Status: DC
  Administered 2016-09-29 – 2016-10-05 (×13): 15 mL via OROMUCOSAL

## 2016-09-29 MED ORDER — NITROGLYCERIN 1 MG/10 ML FOR IR/CATH LAB
INTRA_ARTERIAL | Status: AC
  Filled 2016-09-29: qty 10

## 2016-09-29 MED ORDER — FUROSEMIDE 20 MG PO TABS: 20.0000 mg | ORAL_TABLET | Freq: Two times a day (BID) | ORAL | Status: DC

## 2016-09-29 MED ORDER — VERAPAMIL HCL ER 240 MG PO TBCR: 240.0000 mg | EXTENDED_RELEASE_TABLET | Freq: Two times a day (BID) | ORAL | Status: DC

## 2016-09-29 MED ORDER — IOPAMIDOL (ISOVUE-370) INJECTION 76%
INTRAVENOUS | Status: AC
  Administered 2016-09-29: 02:00:00
  Filled 2016-09-29: qty 50

## 2016-09-29 MED ORDER — ASPIRIN EC 325 MG PO TBEC
325.0000 mg | DELAYED_RELEASE_TABLET | Freq: Every day | ORAL | Status: DC
  Administered 2016-10-03: 325 mg via ORAL
  Filled 2016-09-29: qty 1

## 2016-09-29 MED ORDER — PANTOPRAZOLE SODIUM 40 MG PO TBEC: 40.0000 mg | DELAYED_RELEASE_TABLET | Freq: Every day | ORAL | Status: DC

## 2016-09-29 MED ORDER — SENNOSIDES-DOCUSATE SODIUM 8.6-50 MG PO TABS: 1.0000 | ORAL_TABLET | Freq: Every evening | ORAL | Status: DC | PRN

## 2016-09-29 MED ORDER — PROPOFOL 10 MG/ML IV BOLUS
INTRAVENOUS | Status: DC | PRN
  Administered 2016-09-29: 150 mg via INTRAVENOUS
  Administered 2016-09-29: 50 mg via INTRAVENOUS

## 2016-09-29 MED ORDER — CEFAZOLIN SODIUM-DEXTROSE 2-3 GM-% IV SOLR
INTRAVENOUS | Status: DC | PRN
  Administered 2016-09-29: 2 g via INTRAVENOUS

## 2016-09-29 MED ORDER — ORAL CARE MOUTH RINSE
15.0000 mL | Freq: Four times a day (QID) | OROMUCOSAL | Status: DC
  Administered 2016-09-29 – 2016-10-02 (×14): 15 mL via OROMUCOSAL

## 2016-09-29 MED ORDER — CHLORHEXIDINE GLUCONATE 0.12% ORAL RINSE (MEDLINE KIT): 15.0000 mL | Freq: Two times a day (BID) | OROMUCOSAL | Status: DC

## 2016-09-29 MED ORDER — PANTOPRAZOLE SODIUM 40 MG IV SOLR: 40.0000 mg | Freq: Every day | INTRAVENOUS | Status: DC

## 2016-09-29 MED ORDER — VITAMIN D 1000 UNITS PO TABS: 1000.0000 [IU] | ORAL_TABLET | Freq: Every day | ORAL | Status: DC

## 2016-09-29 MED ORDER — LABETALOL HCL 5 MG/ML IV SOLN: 20.0000 mg | INTRAVENOUS | Status: DC | PRN

## 2016-09-29 MED ORDER — BENAZEPRIL HCL 20 MG PO TABS: 20.0000 mg | ORAL_TABLET | Freq: Every day | ORAL | Status: DC

## 2016-09-29 MED ORDER — IOPAMIDOL (ISOVUE-300) INJECTION 61%
INTRAVENOUS | Status: AC
  Administered 2016-09-29: 75 mL
  Filled 2016-09-29: qty 300

## 2016-09-29 MED ORDER — PROPOFOL 500 MG/50ML IV EMUL
INTRAVENOUS | Status: DC | PRN
  Administered 2016-09-29: 50 ug/kg/min via INTRAVENOUS

## 2016-09-29 MED ORDER — CEFAZOLIN SODIUM-DEXTROSE 2-4 GM/100ML-% IV SOLN
INTRAVENOUS | Status: AC
  Filled 2016-09-29: qty 100

## 2016-09-29 NOTE — Procedures (Signed)
Neuro-Interventional Radiology Post Cerebral Angiogram Procedure Note  History:   83 yo male with history of left MCA distal M1/M2 occlusion.     Baseline mRS:  0 NIHSS:   >16 Last Known Well:  10:30pm ASPECTS:   10 Anesthesia    GETA Skin Puncture:   2:57am  IA tPA:    No, anticoagulation IA Medication:  No Proximal or Distal:  M2  Procedure:   US guided right CFA access.  Left cerebral angiogram.  Findings:   Tortuous arch vessels and tortuous carotid artery precluded delivery of sheath system into the carotid artery, and no treatment could be initiated.   Complications: None  Recommendations: To Neuro ICU Right CFA sheath will be managed by the VIR team   Signed,  Yvone Neu. Loreta Ave, DO

## 2016-09-29 NOTE — Progress Notes (Signed)
Ricky Guzman is a 83 yo male from home with Right side facial droop, aphasia, Right arm weakness. Pt has history of HTN, A-Fib. LKW 2230, CBG 126, NIH 19. Pt transported to IR

## 2016-09-29 NOTE — Anesthesia Postprocedure Evaluation (Signed)
Anesthesia Post Note  Patient: Jahking Lesser  Procedure(s) Performed: Procedure(s) (LRB): RADIOLOGY WITH ANESTHESIA (N/A)  Patient location during evaluation: SICU Anesthesia Type: General Level of consciousness: sedated Pain management: pain level controlled Vital Signs Assessment: post-procedure vital signs reviewed and stable Respiratory status: patient remains intubated per anesthesia plan Cardiovascular status: stable Anesthetic complications: no       Last Vitals:  Vitals:   Oct 26, 2016 0630 2016-10-26 0700  BP: (!) 163/90 (!) 156/96  Pulse: 60 71  Resp: 16 16  Temp:      Last Pain:  Vitals:   Oct 26, 2016 0154  TempSrc: Oral                 Kennieth Rad

## 2016-09-29 NOTE — Progress Notes (Signed)
OT Cancellation Note  Patient Details Name: Zurich Carreno MRN: [id number] DOB: 12-31-1930   Cancelled Treatment:    Reason Eval/Treat Not Completed: Patient not medically ready. Bedrest. Please update activity orders when approprate for therapy. Thanks  [facility] Mukhtar Shams, OT/L  [phone] 09/06/2016 09/20/2016, 7:36 AM

## 2016-09-29 NOTE — Consult Note (Signed)
Admission H&P    Chief Complaint: Acute onset of right hemiplegia and aphasia  HPI: Ricky Guzman is an 83 y.o. male who presented with right hemiplegia and aphasia. He was LKN at 2230 and found by family with above symptoms at 0030. BP on EMS arrival was 196/102, with CBG 126. Code Stroke was initiated en route to the ED.   His PMHx includes atrial fibrillation and HTN. He is currently on warfarin.   LSN: [id number] tPA Given: No: Out of 3 hour IV tPA time window for patients > 53 years of age.    Past Medical History:  Diagnosis Date  . Arrhythmia   . Atrial fibrillation (HCC)   . Hypertension   . Rectal bleeding 06/17/2012  . SBO (small bowel obstruction) (HCC) 04/2014    Past Surgical History:  Procedure Laterality Date  . COLONOSCOPY  06/18/2012   Procedure: COLONOSCOPY;  Surgeon: Florencia Reasons, MD;  Location: [facility] ENDOSCOPY;  Service: Endoscopy;  Laterality: N/A;  . NO PAST SURGERIES      Family History  Problem Relation Age of Onset  . Diabetes Brother    Social History:  reports that he has never smoked. He has never used smokeless tobacco. He reports that he drinks alcohol. He reports that he does not use drugs.  Allergies: No Known Allergies  Home Medications: benazepril (LOTENSIN) 20 MG tablet Take 20 mg by mouth daily. Historical Provider, MD Needs Review  cholecalciferol (VITAMIN D) 1000 UNITS tablet Take 1,000 Units by mouth daily. Historical Provider, MD Needs Review  furosemide (LASIX) 20 MG tablet Take 20 mg by mouth 2 (two) times daily.  Historical Provider, MD Needs Review  latanoprost (XALATAN) 0.005 % ophthalmic solution  Historical Provider, MD Needs Review  LUMIGAN 0.01 % SOLN Place 1 drop into both eyes at bedtime.  Historical Provider, MD Needs Review  pantoprazole (PROTONIX) 40 MG tablet Take 1 tablet (40 mg total) by mouth daily at 12 noon. Vassie Loll, MD Needs Review  polyethylene glycol (MIRALAX / GLYCOLAX) packet Take 17 g by mouth daily. Hold for  diarrhea. Vassie Loll, MD Needs Review  terazosin (HYTRIN) 10 MG capsule Take 10 mg by mouth at bedtime. Historical Provider, MD Needs Review  timolol (TIMOPTIC) 0.5 % ophthalmic solution  Historical Provider, MD Needs Review  verapamil (CALAN-SR) 240 MG CR tablet Take 240 mg by mouth 2 (two) times daily.  Historical Provider, MD Needs Review  warfarin (COUMADIN) 5 MG tablet Take 2.5-5 mg by mouth daily. 2.5 mg on tue, thu and 5 mg all other days Historical Provider, MD Needs Review   ROS: Unable to obtain due to aphasia.   Physical Examination: Blood pressure (!) 188/114, pulse 81, temperature 98.6 F (37 C), temperature source Oral, resp. rate (!) 21, weight 91.1 kg (200 lb 13.4 oz), SpO2 100 %.  HEENT-  [place]/AT  Lungs - No gross wheezing. Respirations unlabored Extremities - Warm and well-perfused  Neurologic Examination: Mental Status: Awake. Decreased alertness. Dense expressive aphasia. Able to comprehend about 50% of simple motor commands. Decreased attention. Limited verbal output consists of severely dysarthric speech fragments.  Cranial Nerves: II:  No blink to threat on right. PERRL III,IV, VI: ptosis not present, left gaze preference; can cross midline conjugately to right with difficulty. No nystagmus.  V,VII: severe right facial droop. Decreased response to tactile and painful stimuli on the right.  VIII: hearing intact to voice IX,X: Unable to visualize palate XII: Does not protrude tongue Motor: RUE: 0/5 RLE: Weak  2/5 withdrawal to noxious LUE: 5/5 LLE: 4/5 Sensory: Decreased response to tactile and painful stimuli on the right. Deep Tendon Reflexes:  1+ brachioradialis and biceps bilaterally. 1+ patellae, 0 achilles. Plantars: Right: upgoing   Left: downgoing Cerebellar: No ataxia disproportionate to weakness with LUE movement. Unable to assess on right.  Gait: Unable to assess.   Results for orders placed or performed during the hospital encounter of 10-25-16  (from the past 48 hour(s))  I-stat troponin, ED     Status: Abnormal   Collection Time: 2016-10-25  1:17 AM  Result Value Ref Range   Troponin i, poc 0.18 (HH) 0.00 - 0.08 ng/mL   Comment NOTIFIED PHYSICIAN    Comment 3            Comment: Due to the release kinetics of cTnI, a negative result within the first hours of the onset of symptoms does not rule out myocardial infarction with certainty. If myocardial infarction is still suspected, repeat the test at appropriate intervals.   I-Stat Chem 8, ED     Status: Abnormal   Collection Time: 2016/10/25  1:18 AM  Result Value Ref Range   Sodium 144 135 - 145 mmol/L   Potassium 3.2 (L) 3.5 - 5.1 mmol/L   Chloride 106 101 - 111 mmol/L   BUN 12 6 - 20 mg/dL   Creatinine, Ser 1.61 (H) 0.61 - 1.24 mg/dL   Glucose, Bld 096 (H) 65 - 99 mg/dL   Calcium, Ion 0.45 (L) 1.15 - 1.40 mmol/L   TCO2 23 0 - 100 mmol/L   Hemoglobin 13.9 13.0 - 17.0 g/dL   HCT 40.9 81.1 - 91.4 %  Protime-INR     Status: None   Collection Time: Oct 25, 2016  1:18 AM  Result Value Ref Range   Prothrombin Time 15.1 11.4 - 15.2 seconds   INR 1.18   APTT     Status: None   Collection Time: Oct 25, 2016  1:18 AM  Result Value Ref Range   aPTT 32 24 - 36 seconds   Ct Angio Head W Or Wo Contrast  Result Date: Oct 25, 2016 CLINICAL DATA:  Right-sided facial droop EXAM: CT ANGIOGRAPHY HEAD AND NECK TECHNIQUE: Multidetector CT imaging of the head and neck was performed using the standard protocol during bolus administration of intravenous contrast. Multiplanar CT image reconstructions and MIPs were obtained to evaluate the vascular anatomy. Carotid stenosis measurements (when applicable) are obtained utilizing NASCET criteria, using the distal internal carotid diameter as the denominator. CONTRAST:  Omnipaque 350 COMPARISON:  None. FINDINGS: CTA NECK FINDINGS Aortic arch: There is no aneurysm or dissection of the visualized ascending aorta or aortic arch. There is a normal variant aortic  arch branching pattern with the brachiocephalic and left common carotid arteries sharing a common origin. The visualized proximal subclavian arteries are normal. There is atherosclerotic calcification within the aortic arch. Right carotid system: The right common carotid origin is widely patent. There is no common carotid or internal carotid artery dissection or aneurysm. No hemodynamically significant stenosis. Left carotid system: The left common carotid origin is widely patent. There is no common carotid or internal carotid artery dissection or aneurysm. No hemodynamically significant stenosis. Minimal Vertebral arteries: The vertebral system is codominant. Both vertebral artery origins are normal. Both vertebral arteries are normal to their confluence with the basilar artery. Skeleton: There is no bony spinal canal stenosis. No lytic or blastic lesions. Other neck: The nasopharynx is clear. The oropharynx and hypopharynx are normal. The epiglottis is normal.  The supraglottic larynx, glottis and subglottic larynx are normal. No retropharyngeal collection. The parapharyngeal spaces are preserved. The parotid and submandibular glands are normal. No sialolithiasis or salivary ductal dilatation. The thyroid gland is normal. There is no cervical lymphadenopathy. Upper chest: No pneumothorax or pleural effusion. No nodules or masses. Review of the MIP images confirms the above findings CTA HEAD FINDINGS Anterior circulation: --Intracranial internal carotid arteries: There its atherosclerotic calcification within both lacerum, cavernous and clinoid segments without hemodynamically significant stenosis. There is fusiform dilatation of the distal right ICA cavernous segment, at the anterior genu, measuring 8 mm. --Anterior cerebral arteries: Normal. --Middle cerebral arteries: There is occlusion of the left middle cerebral artery M2 segment superior division (series 11 images 141-143), approximately 7 mm distal to the MCA  bifurcation. There is attenuated enhancement within the distal left MCA distribution. --Posterior communicating arteries: Absent bilaterally. Posterior circulation: --Posterior cerebral arteries: Normal. --Superior cerebellar arteries: Normal. --Basilar artery: Normal. --Anterior inferior cerebellar arteries: Normal. --Posterior inferior cerebellar arteries: Normal. Venous sinuses: As permitted by contrast timing, patent. Anatomic variants: None Delayed phase: No parenchymal contrast enhancement. Review of the MIP images confirms the above findings IMPRESSION: 1. 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 Attenuated distal left MCA distribution vascularity. 2. Fusiform dilatation of the anterior genu of the right internal carotid artery cavernous segment, measuring 8 mm. 3. Normal cervical CTA. 4. Aortic atherosclerosis. Critical Value/emergent results were called by telephone at the time of interpretation on 09/22/2016 at 2:14 am to Dr. Caryl Pina, who verbally acknowledged these results. The case was also discussed with Dr. Gilmer Mor at 2:29 a.m. Electronically Signed   By: Deatra Robinson M.D.   On: 09/07/2016 02:30   Ct Angio Neck W And/or Wo Contrast  Result Date: 09/14/2016 CLINICAL DATA:  Right-sided facial droop EXAM: CT ANGIOGRAPHY HEAD AND NECK TECHNIQUE: Multidetector CT imaging of the head and neck was performed using the standard protocol during bolus administration of intravenous contrast. Multiplanar CT image reconstructions and MIPs were obtained to evaluate the vascular anatomy. Carotid stenosis measurements (when applicable) are obtained utilizing NASCET criteria, using the distal internal carotid diameter as the denominator. CONTRAST:  Omnipaque 350 COMPARISON:  None. FINDINGS: CTA NECK FINDINGS Aortic arch: There is no aneurysm or dissection of the visualized ascending aorta or aortic arch. There is a normal variant aortic  arch branching pattern with the brachiocephalic and left common carotid arteries sharing a common origin. The visualized proximal subclavian arteries are normal. There is atherosclerotic calcification within the aortic arch. Right carotid system: The right common carotid origin is widely patent. There is no common carotid or internal carotid artery dissection or aneurysm. No hemodynamically significant stenosis. Left carotid system: The left common carotid origin is widely patent. There is no common carotid or internal carotid artery dissection or aneurysm. No hemodynamically significant stenosis. Minimal Vertebral arteries: The vertebral system is codominant. Both vertebral artery origins are normal. Both vertebral arteries are normal to their confluence with the basilar artery. Skeleton: There is no bony spinal canal stenosis. No lytic or blastic lesions. Other neck: The nasopharynx is clear. The oropharynx and hypopharynx are normal. The epiglottis is normal. The supraglottic larynx, glottis and subglottic larynx are normal. No retropharyngeal collection. The parapharyngeal spaces are preserved. The parotid and submandibular glands are normal. No sialolithiasis or salivary ductal dilatation. The thyroid gland is normal. There is no cervical lymphadenopathy. Upper chest: No pneumothorax or pleural effusion.  No nodules or masses. Review of the MIP images confirms the above findings CTA HEAD FINDINGS Anterior circulation: --Intracranial internal carotid arteries: There its atherosclerotic calcification within both lacerum, cavernous and clinoid segments without hemodynamically significant stenosis. There is fusiform dilatation of the distal right ICA cavernous segment, at the anterior genu, measuring 8 mm. --Anterior cerebral arteries: Normal. --Middle cerebral arteries: There is occlusion of the left middle cerebral artery M2 segment superior division (series 11 images 141-143), approximately 7 mm distal to the MCA  bifurcation. There is attenuated enhancement within the distal left MCA distribution. --Posterior communicating arteries: Absent bilaterally. Posterior circulation: --Posterior cerebral arteries: Normal. --Superior cerebellar arteries: Normal. --Basilar artery: Normal. --Anterior inferior cerebellar arteries: Normal. --Posterior inferior cerebellar arteries: Normal. Venous sinuses: As permitted by contrast timing, patent. Anatomic variants: None Delayed phase: No parenchymal contrast enhancement. Review of the MIP images confirms the above findings IMPRESSION: 1. 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 Attenuated distal left MCA distribution vascularity. 2. Fusiform dilatation of the anterior genu of the right internal carotid artery cavernous segment, measuring 8 mm. 3. Normal cervical CTA. 4. Aortic atherosclerosis. Critical Value/emergent results were called by telephone at the time of interpretation on Oct 14, 2016 at 2:14 am to Dr. Caryl Pina, who verbally acknowledged these results. The case was also discussed with Dr. Gilmer Mor at 2:29 a.m. Electronically Signed   By: Deatra Robinson M.D.   On: 2016/10/14 02:30   Ct Head Code Stroke W/o Cm  Addendum Date: Oct 14, 2016   ADDENDUM REPORT: 10-14-16 02:31 ADDENDUM: Findings were discussed with Dr. Caryl Pina at approximately 2 a.m., via telephone. Electronically Signed   By: Deatra Robinson M.D.   On: 2016/10/14 02:31   Result Date: 14-Oct-2016 CLINICAL DATA:  Code stroke.  Right-sided facial droop EXAM: CT HEAD WITHOUT CONTRAST TECHNIQUE: Contiguous axial images were obtained from the base of the skull through the vertex without intravenous contrast. COMPARISON:  None. 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 No CT evidence of acute cortical infarct. No hemorrhage or extra-axial collection. There is periventricular hypoattenuation compatible with chronic microvascular  disease. There is chronic volume loss. Vascular: No hyperdense vessel sign. Atherosclerotic calcification of the vertebral and internal carotid arteries at the skull base. Skull: Normal visualized skull base, calvarium and extracranial soft tissues. Sinuses/Orbits: No sinus fluid levels or advanced mucosal thickening. No mastoid effusion. Normal orbits. ASPECTS [facility] Stroke Program Early CT Score) - Ganglionic level infarction (caudate, lentiform nuclei, internal capsule, insula, M1-M3 cortex): 7 - Supraganglionic infarction (M4-M6 cortex): 3 Total score (0-10 with 10 being normal): 10 IMPRESSION: 1. No acute intracranial hemorrhage. 2. Old right occipital infarct, chronic microvascular ischemia and cerebral atrophy. 3. ASPECTS is 10. Dr.  Caryl Pina was paged at 1:23 a.m. Electronically Signed: By: Deatra Robinson M.D. On: 10-14-16 01:24    Assessment: 83 y.o. male with acute left MCA ischemic infarction. 1. CT head reveals an old right occipital infarction. No hemorrhage noted.  2. CTA reveals occlusion of the left middle cerebral artery M2 segment superior division 7 mm distal to the MCA bifurcation. Attenuated distal left MCA distribution vascularity.  3. Also noted on CTA is fusiform dilatation of the anterior genu of the right internal carotid artery cavernous segment, measuring 8 mm.  4. Stroke Risk Factors - atrial fibrillation, prior stroke, HTN  Plan: 1. Not an IV tPA candidate due to time criteria. 2. The patient is an endovascular candidate.  Consent was obtained from family.  3. MRI of the brain without contrast 4. Echocardiogram 5. Post-endovascular orders per Dr. Loreta Ave, including timing to reinitiation of anticoagulation 6. Repeat CT head in 24 hours.  7. Telemetry monitoring 8. HgbA1c 9. PT consult, OT consult, Speech consult 10. Given advanced age, statin benefits most likely outweighed by risks 40. BP management.    Electronically signed: Dr. Caryl Pina Oct 08, 2016,  2:38 AM

## 2016-09-29 NOTE — Progress Notes (Signed)
NeuroInterventional Radiology Preprocedure Note  83 yo male presents to the ED with acute right sided weakness and facial droop, aphasia.    Last seen normal was 10:30pm.     Baseline mRS is 0  NIHSS >16  He is treated with Eliquis for arrhythmia, and is thus not a candidate for tPA.    CT shows ASPECTS 10.   CTA shows left distal M1/M2 occlusion, with significantly decreased distal MCA branches compared to the right.   Given his age, baseline function, time to presentation, severe NIH, and imaging findings, angiogram and attempt at thrombectomy is indicated.  I do not believe that CTP will add any beneficial info.   I have discussed the indications, and risk/benefit with his family, who have consented on his behalf.  Specific risks include bleeding, infection, arterial injury, need for further procedure/surgery, contrast reaction, kidney injury, intracranial hemorrhage risk of 10-15%, neurologic deficit, cardiopulmonary collapse, death.   They understand and agree to proceed with angiogram and thrombectomy.    Signed,  Yvone Neu. Loreta Ave, DO   t

## 2016-09-29 NOTE — ED Provider Notes (Addendum)
[facility]-EMERGENCY DEPT Provider Note   CSN: [id number] Arrival date & time: 09/25/2016  0110    By signing my name below, I, Valentino Saxon, attest that this documentation has been prepared under the direction and in the presence of Gilda Crease, MD. Electronically Signed: Valentino Saxon, ED Scribe. 09/18/2016. 2:05 AM.  History   Chief Complaint Chief Complaint  Patient presents with  . Code Stroke   The history is provided by a relative. No language interpreter was used.    LEVEL 5 CAVEAT: HPI and ROS limited due to acuity of condition.   HPI Comments: Ricky Guzman is a 83 y.o. male with PMHx of HTN, arrhythmia, atrial fibrilation, brought in by ambulance who presents to the Emergency Department complaining of sudden onset, constant, generalized right-sided weakness that occurred PTA. Per EMS note, code stroke was initiated on route to ED. Per pt's relative, she reports associated right-sided facial droop accompanied by drooling. She states pt was not speaking at this time. She also notes pt is right-handed and states his right hand had a slight "limp" after she attempted to place pt in a sitting position. She notes pt is not being as responsive. Pt's relative states pt is currently on blood thinners. No modifying factors noted. She denies head injury and/or LOC.   Past Medical History:  Diagnosis Date  . Arrhythmia   . Atrial fibrillation (HCC)   . Hypertension   . Rectal bleeding 06/17/2012  . SBO (small bowel obstruction) (HCC) 04/2014    Patient Active Problem List   Diagnosis Date Noted  . Chronic atrial fibrillation (HCC)   . Fever   . Hypokalemia   . SBO (small bowel obstruction) (HCC) 04/21/2014  . Essential hypertension 04/21/2014  . Obesity (BMI 30-39.9) 04/21/2014  . Glaucoma 04/21/2014  . Atrial fibrillation, chronic (HCC) 04/21/2014  . Lactic acid acidosis 04/21/2014  . Long term current use of anticoagulant therapy 04/21/2014  . BPH (benign  prostatic hyperplasia) 06/20/2012  . Hypertension 06/18/2012  . Rectal bleeding 06/17/2012  . Anemia 06/17/2012  . Atrial fibrillation (HCC) 06/17/2012    Past Surgical History:  Procedure Laterality Date  . COLONOSCOPY  06/18/2012   Procedure: COLONOSCOPY;  Surgeon: Florencia Reasons, MD;  Location: [facility] ENDOSCOPY;  Service: Endoscopy;  Laterality: N/A;  . NO PAST SURGERIES         Home Medications    Prior to Admission medications   Medication Sig Start Date End Date Taking? Authorizing Provider  benazepril (LOTENSIN) 20 MG tablet Take 20 mg by mouth daily.    Historical Provider, MD  cholecalciferol (VITAMIN D) 1000 UNITS tablet Take 1,000 Units by mouth daily.    Historical Provider, MD  furosemide (LASIX) 20 MG tablet Take 20 mg by mouth 2 (two) times daily.  04/02/14   Historical Provider, MD  latanoprost (XALATAN) 0.005 % ophthalmic solution  03/17/16   Historical Provider, MD  LUMIGAN 0.01 % SOLN Place 1 drop into both eyes at bedtime.  02/10/14   Historical Provider, MD  pantoprazole (PROTONIX) 40 MG tablet Take 1 tablet (40 mg total) by mouth daily at 12 noon. 04/25/14   Vassie Loll, MD  polyethylene glycol [facility] / GLYCOLAX) packet Take 17 g by mouth daily. Hold for diarrhea. 04/25/14   Vassie Loll, MD  terazosin (HYTRIN) 10 MG capsule Take 10 mg by mouth at bedtime.    Historical Provider, MD  timolol (TIMOPTIC) 0.5 % ophthalmic solution  12/30/15   Historical Provider, MD  verapamil (CALAN-SR)  240 MG CR tablet Take 240 mg by mouth 2 (two) times daily.     Historical Provider, MD  warfarin (COUMADIN) 5 MG tablet Take 2.5-5 mg by mouth daily. 2.5 mg on tue, thu and 5 mg all other days 03/05/14   Historical Provider, MD    Family History Family History  Problem Relation Age of Onset  . Diabetes Brother     Social History Social History  Substance Use Topics  . Smoking status: Never Smoker  . Smokeless tobacco: Never Used  . Alcohol use Yes     Comment: "used to  take a drink before going on BP pills in 1998; still have 1 drink on New Year's Eve; that's it for the year"     Allergies   Patient has no known allergies.   Review of Systems Review of Systems  Unable to perform ROS: Acuity of condition  Neurological: Positive for weakness. Negative for syncope.     Physical Exam Updated Vital Signs BP (!) 188/114   Pulse 81   Temp 98.6 F (37 C) (Oral)   Resp (!) 21   Wt 200 lb 13.4 oz (91.1 kg)   SpO2 100%   BMI 28.82 kg/m   Physical Exam  Constitutional: He is oriented to person, place, and time. He appears well-developed and well-nourished. No distress.  HENT:  Head: Normocephalic and atraumatic.  Right Ear: Hearing normal.  Left Ear: Hearing normal.  Nose: Nose normal.  Mouth/Throat: Oropharynx is clear and moist and mucous membranes are normal.  Eyes: Conjunctivae and EOM are normal. Pupils are equal, round, and reactive to light.  Persistent left gaze, unable to track across midline with eyes.   Neck: Normal range of motion. Neck supple.  Cardiovascular: S1 normal and S2 normal.  Exam reveals no gallop and no friction rub.   No murmur heard. Irregularly irregular.   Pulmonary/Chest: Effort normal and breath sounds normal. No respiratory distress. He exhibits no tenderness.  Abdominal: Soft. Normal appearance and bowel sounds are normal. There is no hepatosplenomegaly. There is no tenderness. There is no rebound, no guarding, no tenderness at McBurney's point and negative Murphy's sign. No hernia.  Musculoskeletal: He exhibits no edema.  Flaccid right upper extremity with limited movement right lower.   Neurological: He is alert and oriented to person, place, and time. He has normal strength. No cranial nerve deficit or sensory deficit. Coordination normal. GCS eye subscore is 4. GCS verbal subscore is 5. GCS motor subscore is 6.  Skin: Skin is warm, dry and intact. No rash noted. No cyanosis.  Psychiatric: He has a normal mood  and affect. His speech is normal and behavior is normal. Thought content normal.  Nursing note and vitals reviewed.    ED Treatments / Results   DIAGNOSTIC STUDIES: Oxygen Saturation is 100% on RA, normal by my interpretation.    COORDINATION OF CARE: 1:51 AM Discussed treatment plan with pt's relative's at bedside which includes labs, CT angio imaging of neck, head and code stroke and pt's relatives agreed to plan.   Labs (all labs ordered are listed, but only abnormal results are displayed) Labs Reviewed  I-STAT TROPOININ, ED - Abnormal; Notable for the following:       Result Value   Troponin i, poc 0.18 (*)    All other components within normal limits  I-STAT CHEM 8, ED - Abnormal; Notable for the following:    Potassium 3.2 (*)    Creatinine, Ser 1.30 (*)  Glucose, Bld 126 (*)    Calcium, Ion 1.10 (*)    All other components within normal limits  PROTIME-INR  APTT  CBC WITH DIFFERENTIAL/PLATELET  COMPREHENSIVE METABOLIC PANEL  CBC WITH DIFFERENTIAL/PLATELET  CBG MONITORING, ED    EKG  EKG Interpretation  Date/Time:  Friday 2016-10-12 01:51:11 EDT Ventricular Rate:  91 PR Interval:    QRS Duration: 90 QT Interval:  406 QTC Calculation: 500 R Axis:   68 Text Interpretation:  Atrial fibrillation Ventricular premature complex Probable inferior infarct, age indeterminate Repol abnrm suggests ischemia, diffuse leads Confirmed by POLLINA  MD, CHRISTOPHER [phone]) on Oct 12, 2016 2:46:19 AM       Radiology Ct Angio Head W Or Wo Contrast  Result Date: Oct 12, 2016 CLINICAL DATA:  Right-sided facial droop EXAM: CT ANGIOGRAPHY HEAD AND NECK TECHNIQUE: Multidetector CT imaging of the head and neck was performed using the standard protocol during bolus administration of intravenous contrast. Multiplanar CT image reconstructions and MIPs were obtained to evaluate the vascular anatomy. Carotid stenosis measurements (when applicable) are obtained utilizing NASCET criteria,  using the distal internal carotid diameter as the denominator. CONTRAST:  Omnipaque 350 COMPARISON:  None. FINDINGS: CTA NECK FINDINGS Aortic arch: There is no aneurysm or dissection of the visualized ascending aorta or aortic arch. There is a normal variant aortic arch branching pattern with the brachiocephalic and left common carotid arteries sharing a common origin. The visualized proximal subclavian arteries are normal. There is atherosclerotic calcification within the aortic arch. Right carotid system: The right common carotid origin is widely patent. There is no common carotid or internal carotid artery dissection or aneurysm. No hemodynamically significant stenosis. Left carotid system: The left common carotid origin is widely patent. There is no common carotid or internal carotid artery dissection or aneurysm. No hemodynamically significant stenosis. Minimal Vertebral arteries: The vertebral system is codominant. Both vertebral artery origins are normal. Both vertebral arteries are normal to their confluence with the basilar artery. Skeleton: There is no bony spinal canal stenosis. No lytic or blastic lesions. Other neck: The nasopharynx is clear. The oropharynx and hypopharynx are normal. The epiglottis is normal. The supraglottic larynx, glottis and subglottic larynx are normal. No retropharyngeal collection. The parapharyngeal spaces are preserved. The parotid and submandibular glands are normal. No sialolithiasis or salivary ductal dilatation. The thyroid gland is normal. There is no cervical lymphadenopathy. Upper chest: No pneumothorax or pleural effusion. No nodules or masses. Review of the MIP images confirms the above findings CTA HEAD FINDINGS Anterior circulation: --Intracranial internal carotid arteries: There its atherosclerotic calcification within both lacerum, cavernous and clinoid segments without hemodynamically significant stenosis. There is fusiform dilatation of the distal right ICA  cavernous segment, at the anterior genu, measuring 8 mm. --Anterior cerebral arteries: Normal. --Middle cerebral arteries: There is occlusion of the left middle cerebral artery M2 segment superior division (series 11 images 141-143), approximately 7 mm distal to the MCA bifurcation. There is attenuated enhancement within the distal left MCA distribution. --Posterior communicating arteries: Absent bilaterally. Posterior circulation: --Posterior cerebral arteries: Normal. --Superior cerebellar arteries: Normal. --Basilar artery: Normal. --Anterior inferior cerebellar arteries: Normal. --Posterior inferior cerebellar arteries: Normal. Venous sinuses: As permitted by contrast timing, patent. Anatomic variants: None Delayed phase: No parenchymal contrast enhancement. Review of the MIP images confirms the above findings IMPRESSION: 1. 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 Attenuated distal left MCA distribution vascularity. 2. Fusiform dilatation of the anterior genu of the  right internal carotid artery cavernous segment, measuring 8 mm. 3. Normal cervical CTA. 4. Aortic atherosclerosis. Critical Value/emergent results were called by telephone at the time of interpretation on Oct 09, 2016 at 2:14 am to Dr. Caryl Pina, who verbally acknowledged these results. The case was also discussed with Dr. Gilmer Mor at 2:29 a.m. Electronically Signed   By: Deatra Robinson M.D.   On: 10-09-16 02:30   Ct Angio Neck W And/or Wo Contrast  Result Date: 2016/10/09 CLINICAL DATA:  Right-sided facial droop EXAM: CT ANGIOGRAPHY HEAD AND NECK TECHNIQUE: Multidetector CT imaging of the head and neck was performed using the standard protocol during bolus administration of intravenous contrast. Multiplanar CT image reconstructions and MIPs were obtained to evaluate the vascular anatomy. Carotid stenosis measurements (when applicable) are obtained utilizing NASCET  criteria, using the distal internal carotid diameter as the denominator. CONTRAST:  Omnipaque 350 COMPARISON:  None. FINDINGS: CTA NECK FINDINGS Aortic arch: There is no aneurysm or dissection of the visualized ascending aorta or aortic arch. There is a normal variant aortic arch branching pattern with the brachiocephalic and left common carotid arteries sharing a common origin. The visualized proximal subclavian arteries are normal. There is atherosclerotic calcification within the aortic arch. Right carotid system: The right common carotid origin is widely patent. There is no common carotid or internal carotid artery dissection or aneurysm. No hemodynamically significant stenosis. Left carotid system: The left common carotid origin is widely patent. There is no common carotid or internal carotid artery dissection or aneurysm. No hemodynamically significant stenosis. Minimal Vertebral arteries: The vertebral system is codominant. Both vertebral artery origins are normal. Both vertebral arteries are normal to their confluence with the basilar artery. Skeleton: There is no bony spinal canal stenosis. No lytic or blastic lesions. Other neck: The nasopharynx is clear. The oropharynx and hypopharynx are normal. The epiglottis is normal. The supraglottic larynx, glottis and subglottic larynx are normal. No retropharyngeal collection. The parapharyngeal spaces are preserved. The parotid and submandibular glands are normal. No sialolithiasis or salivary ductal dilatation. The thyroid gland is normal. There is no cervical lymphadenopathy. Upper chest: No pneumothorax or pleural effusion. No nodules or masses. Review of the MIP images confirms the above findings CTA HEAD FINDINGS Anterior circulation: --Intracranial internal carotid arteries: There its atherosclerotic calcification within both lacerum, cavernous and clinoid segments without hemodynamically significant stenosis. There is fusiform dilatation of the distal  right ICA cavernous segment, at the anterior genu, measuring 8 mm. --Anterior cerebral arteries: Normal. --Middle cerebral arteries: There is occlusion of the left middle cerebral artery M2 segment superior division (series 11 images 141-143), approximately 7 mm distal to the MCA bifurcation. There is attenuated enhancement within the distal left MCA distribution. --Posterior communicating arteries: Absent bilaterally. Posterior circulation: --Posterior cerebral arteries: Normal. --Superior cerebellar arteries: Normal. --Basilar artery: Normal. --Anterior inferior cerebellar arteries: Normal. --Posterior inferior cerebellar arteries: Normal. Venous sinuses: As permitted by contrast timing, patent. Anatomic variants: None Delayed phase: No parenchymal contrast enhancement. Review of the MIP images confirms the above findings IMPRESSION: 1. 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 Attenuated distal left MCA distribution vascularity. 2. Fusiform dilatation of the anterior genu of the right internal carotid artery cavernous segment, measuring 8 mm. 3. Normal cervical CTA. 4. Aortic atherosclerosis. Critical Value/emergent results were called by telephone at the time of interpretation on 2016-10-09 at 2:14 am to Dr. Caryl Pina, who verbally acknowledged these results. The case was also discussed  with Dr. Gilmer Mor at 2:29 a.m. Electronically Signed   By: Deatra Robinson M.D.   On: Oct 08, 2016 02:30   Ct Head Code Stroke W/o Cm  Addendum Date: 08-Oct-2016   ADDENDUM REPORT: 10/08/2016 02:31 ADDENDUM: Findings were discussed with Dr. Caryl Pina at approximately 2 a.m., via telephone. Electronically Signed   By: Deatra Robinson M.D.   On: Oct 08, 2016 02:31   Result Date: 2016/10/08 CLINICAL DATA:  Code stroke.  Right-sided facial droop EXAM: CT HEAD WITHOUT CONTRAST TECHNIQUE: Contiguous axial images were obtained from the base of the skull through  the vertex without intravenous contrast. COMPARISON:  None. 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 No CT evidence of acute cortical infarct. No hemorrhage or extra-axial collection. There is periventricular hypoattenuation compatible with chronic microvascular disease. There is chronic volume loss. Vascular: No hyperdense vessel sign. Atherosclerotic calcification of the vertebral and internal carotid arteries at the skull base. Skull: Normal visualized skull base, calvarium and extracranial soft tissues. Sinuses/Orbits: No sinus fluid levels or advanced mucosal thickening. No mastoid effusion. Normal orbits. ASPECTS [facility] Stroke Program Early CT Score) - Ganglionic level infarction (caudate, lentiform nuclei, internal capsule, insula, M1-M3 cortex): 7 - Supraganglionic infarction (M4-M6 cortex): 3 Total score (0-10 with 10 being normal): 10 IMPRESSION: 1. No acute intracranial hemorrhage. 2. Old right occipital infarct, chronic microvascular ischemia and cerebral atrophy. 3. ASPECTS is 10. Dr.  Caryl Pina was paged at 1:23 a.m. Electronically Signed: By: Deatra Robinson M.D. On: 10/08/16 01:24    Procedures Procedures (including critical care time)  Medications Ordered in ED Medications  iopamidol (ISOVUE-370) 76 % injection (  Contrast Given 2016/10/08 0130)  labetalol (NORMODYNE,TRANDATE) injection 10 mg (10 mg Intravenous Given 10/08/2016 0205)     Initial Impression / Assessment and Plan / ED Course  I have reviewed the triage vital signs and the nursing notes.  Pertinent labs & imaging results that were available during my care of the patient were reviewed by me and considered in my medical decision making (see chart for details).     Patient brought to the emergency department by EMS from home. Patient had onset of aphasia, facial droop, right-sided flaccidity at home. Patient is on at work was secondary to a history of atrial fibrillation, therefore not a candidate for  IV TPA. Patient was therefore sent for CT angiography and arrangements made for neuro interventional angiogram with possible removal of clot.  Patient hypertensive on arrival, treated with IV labetalol.   CHA2DS2/VAS Stroke Risk Points      5 >= 2 Points: High Risk  1 - 1.99 Points: Medium Risk  0 Points: Low Risk    The previous score was 3 on 02/03/2016.:  Change:         Details    Note: External data might be a factor in metrics not marked with    Points Metrics   This score determines the patient's risk of having a stroke if the  patient has atrial fibrillation.       0 Has Congestive Heart Failure:  No   0 Has Vascular Disease:  No   1 Has Hypertension:  Yes   2 Age:  65   0 Has Diabetes:  No   2 Had Stroke:  Yes Had TIA:  No Had thromboembolism:  No   0 Male:  No        CRITICAL CARE Performed by: Gilda Crease   Total critical care time: 30 minutes  Critical care time was exclusive of separately billable procedures and treating other patients.  Critical care was necessary to treat or prevent imminent or life-threatening deterioration.  Critical care was time spent personally by me on the following activities: development of treatment plan with patient and/or surrogate as well as nursing, discussions with consultants, evaluation of patient's response to treatment, examination of patient, obtaining history from patient or surrogate, ordering and performing treatments and interventions, ordering and review of laboratory studies, ordering and review of radiographic studies, pulse oximetry and re-evaluation of patient's condition.   Final Clinical Impressions(s) / ED Diagnoses   Final diagnoses:  Cerebral infarction, unspecified mechanism (HCC)    New Prescriptions New Prescriptions   No medications on file    I personally performed the services described in this documentation, which was scribed in my presence. The recorded information has been  reviewed and is accurate.     Gilda Crease, MD 2016/10/01 1610    Gilda Crease, MD 10/01/2016 [phone]

## 2016-09-29 NOTE — Anesthesia Procedure Notes (Signed)
Procedure Name: Intubation Date/Time: 09/23/2016 2:51 AM Performed by: Manuela Schwartz B Pre-anesthesia Checklist: Patient identified, Emergency Drugs available, Suction available, Patient being monitored and Timeout performed Patient Re-evaluated:Patient Re-evaluated prior to inductionOxygen Delivery Method: Circle system utilized Preoxygenation: Pre-oxygenation with 100% oxygen Intubation Type: IV induction and Rapid sequence Laryngoscope Size: Mac and 3 Grade View: Grade II Tube type: Subglottic suction tube Tube size: 7.5 mm Number of attempts: 1 Airway Equipment and Method: Stylet Placement Confirmation: ETT inserted through vocal cords under direct vision,  positive ETCO2 and breath sounds checked- equal and bilateral Secured at: 22 cm Tube secured with: Tape Dental Injury: Teeth and Oropharynx as per pre-operative assessment

## 2016-09-29 NOTE — Progress Notes (Signed)
CRITICAL VALUE ALERT  Critical value received:  Troponin 0.18  Date of notification: 09/14/2016  Time of notification:  1230  Critical value read back:Yes.    Nurse who received alert:  Gari Crown, RN  MD notified (1st page): CCM NP  Time of first page:  1300  MD notified (2nd page):  Time of second page:  Responding MD:  CCM NP  Time MD responded:  1300

## 2016-09-29 NOTE — Consult Note (Signed)
PULMONARY / CRITICAL CARE MEDICINE   Name: Ricky Guzman MRN: [id number] DOB: May 20, 1931    ADMISSION DATE:  2016-10-26 CONSULTATION DATE:  Oct 26, 2016  REFERRING MD:  Otelia Limes  CHIEF COMPLAINT:  AMS  HISTORY OF PRESENT ILLNESS:  Pt is encephelopathic; therefore, this HPI is obtained from chart review. Ricky Guzman is a 83 y.o. male with PMH as outlined below.  He was found at 0030 4/27 with R-sided weakness and R-sided facial droop in addition to being aphasic and non-verbal. LSN by family at 2230 the night prior. Family denies any head injury or trauma and felt that pt was in his usual state of health the night prior. CTA with occlusion of left MCA M2-segment. Seen by neurology and IR who attempted percutaneous left MCA thrombectomy however was unsuccessful 2/2 patient anatomy. He then returned to ICU on vent and PCCM asked to assist with vent management.  PAST MEDICAL HISTORY :  He  has a past medical history of Arrhythmia; Atrial fibrillation (HCC); Hypertension; Rectal bleeding (06/17/2012); and SBO (small bowel obstruction) (HCC) (04/2014).  PAST SURGICAL HISTORY: He  has a past surgical history that includes No past surgeries; Colonoscopy (06/18/2012); IR PERCUTANEOUS ART THROMBECTOMY/INFUSION INTRACRANIAL INC DIAG ANGIO (2016/10/26); and IR US Guide Vasc Access Left (10-26-2016).  No Known Allergies  No current facility-administered medications on file prior to encounter.    Current Outpatient Prescriptions on File Prior to Encounter  Medication Sig  . benazepril (LOTENSIN) 20 MG tablet Take 20 mg by mouth daily.  . cholecalciferol (VITAMIN D) 1000 UNITS tablet Take 1,000 Units by mouth daily.  . furosemide (LASIX) 20 MG tablet Take 20 mg by mouth 2 (two) times daily.   Marland Kitchen latanoprost (XALATAN) 0.005 % ophthalmic solution   . LUMIGAN 0.01 % SOLN Place 1 drop into both eyes at bedtime.   . pantoprazole (PROTONIX) 40 MG tablet Take 1 tablet (40 mg total) by mouth daily at 12 noon.  .  polyethylene glycol (MIRALAX / GLYCOLAX) packet Take 17 g by mouth daily. Hold for diarrhea.  . terazosin (HYTRIN) 10 MG capsule Take 10 mg by mouth at bedtime.  . timolol (TIMOPTIC) 0.5 % ophthalmic solution   . verapamil (CALAN-SR) 240 MG CR tablet Take 240 mg by mouth 2 (two) times daily.   Marland Kitchen warfarin (COUMADIN) 5 MG tablet Take 2.5-5 mg by mouth daily. 2.5 mg on tue, thu and 5 mg all other days    FAMILY HISTORY:  His indicated that the status of his brother is unknown.    SOCIAL HISTORY: He  reports that he has never smoked. He has never used smokeless tobacco. He reports that he drinks alcohol. He reports that he does not use drugs.  REVIEW OF SYSTEMS:   Unable to obtain as pt is encephalopathic.  SUBJECTIVE:  On vent, sedated.  VITAL SIGNS: BP (!) 157/95   Pulse (!) 53   Temp 98.6 F (37 C) (Oral)   Resp 16   Ht 5\' 6"  (1.676 m)   Wt 91.1 kg (200 lb 13.4 oz)   SpO2 99%   BMI 32.42 kg/m   HEMODYNAMICS:    VENTILATOR SETTINGS: Vent Mode: PRVC FiO2 (%):  [40 %] 40 % Set Rate:  [16 bmp] 16 bmp Vt Set:  [510 mL] 510 mL PEEP:  [5 cmH20] 5 cmH20 Plateau Pressure:  [19 cmH20] 19 cmH20  INTAKE / OUTPUT: I/O last 3 completed shifts: In: 1119.9 [I.V.:1119.9] Out: 350 [Urine:250; Blood:100]   PHYSICAL EXAMINATION: General: Elderly appearing male,  in NAD. Neuro: Sedated, non-responsive. HEENT: [place]/AT. PERRL, sclerae anicteric. Cardiovascular: IRIR, no M/R/G.  Lungs: Respirations even and unlabored.  CTA bilaterally, No W/R/R. Abdomen: BS x 4, soft, NT/ND.  Musculoskeletal: No gross deformities, no edema.  Skin: Intact, warm, no rashes.  LABS:  BMET  Recent Labs Lab 10-22-16 0118  NA 142  144  K 3.3*  3.2*  CL 108  106  CO2 19*  BUN 11  12  CREATININE 1.44*  1.30*  GLUCOSE 121*  126*    Electrolytes  Recent Labs Lab 22-Oct-2016 0118  CALCIUM 8.8*    CBC  Recent Labs Lab 10-22-16 0118 Oct 22, 2016 0236  WBC  --  12.7*  HGB 13.9 13.2  HCT  41.0 40.4  PLT  --  184    Coag's  Recent Labs Lab 10-22-16 0118  APTT 32  INR 1.18    Sepsis Markers No results for input(s): LATICACIDVEN, PROCALCITON, O2SATVEN in the last 168 hours.  ABG  Recent Labs Lab 22-Oct-2016 0630  PHART 7.473*  PCO2ART 28.5*  PO2ART 162*    Liver Enzymes  Recent Labs Lab 10-22-2016 0118  AST 30  ALT 10*  ALKPHOS 61  BILITOT 0.6  ALBUMIN 3.9    Cardiac Enzymes No results for input(s): TROPONINI, PROBNP in the last 168 hours.  Glucose No results for input(s): GLUCAP in the last 168 hours.  Imaging Ct Angio Head W Or Wo Contrast  Result Date: 10/22/2016 CLINICAL DATA:  Right-sided facial droop EXAM: CT ANGIOGRAPHY HEAD AND NECK TECHNIQUE: Multidetector CT imaging of the head and neck was performed using the standard protocol during bolus administration of intravenous contrast. Multiplanar CT image reconstructions and MIPs were obtained to evaluate the vascular anatomy. Carotid stenosis measurements (when applicable) are obtained utilizing NASCET criteria, using the distal internal carotid diameter as the denominator. CONTRAST:  Omnipaque 350 COMPARISON:  None. FINDINGS: CTA NECK FINDINGS Aortic arch: There is no aneurysm or dissection of the visualized ascending aorta or aortic arch. There is a normal variant aortic arch branching pattern with the brachiocephalic and left common carotid arteries sharing a common origin. The visualized proximal subclavian arteries are normal. There is atherosclerotic calcification within the aortic arch. Right carotid system: The right common carotid origin is widely patent. There is no common carotid or internal carotid artery dissection or aneurysm. No hemodynamically significant stenosis. Left carotid system: The left common carotid origin is widely patent. There is no common carotid or internal carotid artery dissection or aneurysm. No hemodynamically significant stenosis. Minimal Vertebral arteries: The  vertebral system is codominant. Both vertebral artery origins are normal. Both vertebral arteries are normal to their confluence with the basilar artery. Skeleton: There is no bony spinal canal stenosis. No lytic or blastic lesions. Other neck: The nasopharynx is clear. The oropharynx and hypopharynx are normal. The epiglottis is normal. The supraglottic larynx, glottis and subglottic larynx are normal. No retropharyngeal collection. The parapharyngeal spaces are preserved. The parotid and submandibular glands are normal. No sialolithiasis or salivary ductal dilatation. The thyroid gland is normal. There is no cervical lymphadenopathy. Upper chest: No pneumothorax or pleural effusion. No nodules or masses. Review of the MIP images confirms the above findings CTA HEAD FINDINGS Anterior circulation: --Intracranial internal carotid arteries: There its atherosclerotic calcification within both lacerum, cavernous and clinoid segments without hemodynamically significant stenosis. There is fusiform dilatation of the distal right ICA cavernous segment, at the anterior genu, measuring 8 mm. --Anterior cerebral arteries: Normal. --Middle cerebral arteries: There is occlusion  of the left middle cerebral artery M2 segment superior division (series 11 images 141-143), approximately 7 mm distal to the MCA bifurcation. There is attenuated enhancement within the distal left MCA distribution. --Posterior communicating arteries: Absent bilaterally. Posterior circulation: --Posterior cerebral arteries: Normal. --Superior cerebellar arteries: Normal. --Basilar artery: Normal. --Anterior inferior cerebellar arteries: Normal. --Posterior inferior cerebellar arteries: Normal. Venous sinuses: As permitted by contrast timing, patent. Anatomic variants: None Delayed phase: No parenchymal contrast enhancement. Review of the MIP images confirms the above findings IMPRESSION: 1. Occlusion of the left middle cerebral artery M2 segment superior  division 7 mm distal to the MCA bifurcation. Attenuated distal left MCA distribution vascularity. 2. Fusiform dilatation of the anterior genu of the right internal carotid artery cavernous segment, measuring 8 mm. 3. Normal cervical CTA. 4. Aortic atherosclerosis. Critical Value/emergent results were called by telephone at the time of interpretation on Oct 17, 2016 at 2:14 am to Dr. Caryl Pina, who verbally acknowledged these results. The case was also discussed with Dr. Gilmer Mor at 2:29 a.m. Electronically Signed   By: Deatra Robinson M.D.   On: 10-17-2016 02:30   Ct Angio Neck W And/or Wo Contrast  Result Date: 17-Oct-2016 CLINICAL DATA:  Right-sided facial droop EXAM: CT ANGIOGRAPHY HEAD AND NECK TECHNIQUE: Multidetector CT imaging of the head and neck was performed using the standard protocol during bolus administration of intravenous contrast. Multiplanar CT image reconstructions and MIPs were obtained to evaluate the vascular anatomy. Carotid stenosis measurements (when applicable) are obtained utilizing NASCET criteria, using the distal internal carotid diameter as the denominator. CONTRAST:  Omnipaque 350 COMPARISON:  None. FINDINGS: CTA NECK FINDINGS Aortic arch: There is no aneurysm or dissection of the visualized ascending aorta or aortic arch. There is a normal variant aortic arch branching pattern with the brachiocephalic and left common carotid arteries sharing a common origin. The visualized proximal subclavian arteries are normal. There is atherosclerotic calcification within the aortic arch. Right carotid system: The right common carotid origin is widely patent. There is no common carotid or internal carotid artery dissection or aneurysm. No hemodynamically significant stenosis. Left carotid system: The left common carotid origin is widely patent. There is no common carotid or internal carotid artery dissection or aneurysm. No hemodynamically significant stenosis. Minimal Vertebral arteries: The  vertebral system is codominant. Both vertebral artery origins are normal. Both vertebral arteries are normal to their confluence with the basilar artery. Skeleton: There is no bony spinal canal stenosis. No lytic or blastic lesions. Other neck: The nasopharynx is clear. The oropharynx and hypopharynx are normal. The epiglottis is normal. The supraglottic larynx, glottis and subglottic larynx are normal. No retropharyngeal collection. The parapharyngeal spaces are preserved. The parotid and submandibular glands are normal. No sialolithiasis or salivary ductal dilatation. The thyroid gland is normal. There is no cervical lymphadenopathy. Upper chest: No pneumothorax or pleural effusion. No nodules or masses. Review of the MIP images confirms the above findings CTA HEAD FINDINGS Anterior circulation: --Intracranial internal carotid arteries: There its atherosclerotic calcification within both lacerum, cavernous and clinoid segments without hemodynamically significant stenosis. There is fusiform dilatation of the distal right ICA cavernous segment, at the anterior genu, measuring 8 mm. --Anterior cerebral arteries: Normal. --Middle cerebral arteries: There is occlusion of the left middle cerebral artery M2 segment superior division (series 11 images 141-143), approximately 7 mm distal to the MCA bifurcation. There is attenuated enhancement within the distal left MCA distribution. --Posterior communicating arteries: Absent bilaterally. Posterior circulation: --Posterior cerebral arteries: Normal. --Superior cerebellar arteries: Normal. --  Basilar artery: Normal. --Anterior inferior cerebellar arteries: Normal. --Posterior inferior cerebellar arteries: Normal. Venous sinuses: As permitted by contrast timing, patent. Anatomic variants: None Delayed phase: No parenchymal contrast enhancement. Review of the MIP images confirms the above findings IMPRESSION: 1. Occlusion of the left middle cerebral artery M2 segment superior  division 7 mm distal to the MCA bifurcation. Attenuated distal left MCA distribution vascularity. 2. Fusiform dilatation of the anterior genu of the right internal carotid artery cavernous segment, measuring 8 mm. 3. Normal cervical CTA. 4. Aortic atherosclerosis. Critical Value/emergent results were called by telephone at the time of interpretation on 09/17/2016 at 2:14 am to Dr. Caryl Pina, who verbally acknowledged these results. The case was also discussed with Dr. Gilmer Mor at 2:29 a.m. Electronically Signed   By: Deatra Robinson M.D.   On: 09/16/2016 02:30   Ir US Guide Vasc Access Left  Result Date: 09/03/2016 INDICATION: 83 year old male with a history of acute left MCA occlusion EXAM: ULTRASOUND GUIDED ACCESS RIGHT COMMON FEMORAL ARTERY. LEFT CERVICAL AND CEREBRAL ANGIOGRAM FOR ATTEMPT AT LEFT MCA THROMBECTOMY COMPARISON:  CT and CT angiogram 09/13/2016 MEDICATIONS: 2.0 g Ancef. The antibiotic was administered within 1 hour of the procedure ANESTHESIA/SEDATION: General anesthesia CONTRAST:  75 cc Isovue 300 FLUOROSCOPY TIME:  Fluoroscopy Time: 48 minutes 24 seconds (1053 mGy). COMPLICATIONS: None TECHNIQUE: Informed written consent was obtained from the patient after a thorough discussion of the procedural risks, benefits and alternatives. All questions were addressed. Maximal Sterile Barrier Technique was utilized including caps, mask, sterile gowns, sterile gloves, sterile drape, hand hygiene and skin antiseptic. A timeout was performed prior to the initiation of the procedure. PROCEDURE: Patient positioned supine position on the fluoroscopy table. The right inguinal region was prepped and draped in the usual sterile fashion. 1% lidocaine was used for local anesthesia at the puncture site. Ultrasound survey of the right inguinal region was performed with images stored and sent to PACs. A micropuncture needle was used access the right common femoral artery under ultrasound. With excellent arterial  blood flow returned, and an .018 micro wire was passed through the needle, observed enter the abdominal aorta under fluoroscopy. The needle was removed, and a micropuncture sheath was placed over the wire. The inner dilator and wire were removed, and an 035 Bentson wire was advanced under fluoroscopy into the abdominal aorta. The sheath was removed and a standard 5 [place] vascular sheath was placed. The dilator was removed and the sheath was flushed. JB 1 catheter was advanced over the wire, with the wire and catheter combination placed into the proximal descending thoracic aorta. Wire was removed and double flush was performed. JB 1 catheter was advanced into the common origin of the innominate artery and left common carotid artery. With the catheter at the origin of the left common carotid artery, wire was advanced. Catheter would not advance into the common carotid artery. Catheter was then exchanged for a Sim 2 catheter, with catheter re- formed at the aortic arch. Sim 2 catheter was used to select the origin of left common carotid artery. The catheter would not advance beyond the series of turns at the proximal left common carotid artery for purchase. Advancing both Glidewire and roadrunner wire would not allow placement of the catheter. Sim 2 catheter was then exchanged for a JB 2 catheter. Catheter would not advance into the common carotid artery over wire. Davis catheter was then selected. Days catheter would not advance it pass the series a turned to the proximal common  carotid artery over a Glidewire or roadrunner wire. The 55 cm bright tip 8 French sheath was advanced into the aorta, with series if catheters again used to an attempt to select the common carotid artery with extra support from the sheath. No catheter was successful in advancing beyond the series of initial turns within the common carotid artery. The 55 cm bright tip sheath was then exchanged for an 8 French 70 cm angled flexor sheath.  Again, no catheter was successful and advancing within the left common carotid artery. Six French envoy Simmons 2 catheter was then placed through the flexor sheath, unsuccessful a navigating beyond the initial turns. This catheter was removed and the 8 French 55 cm bright tip sheath was then again placed with the Envoy catheter used in an attempt to select the common carotid artery. The Envoy a catheter would not advance beyond the initial turns of the common carotid artery. Coaxial microcatheter system using a coaxial pro 18 catheter and synchro soft were advanced into the common carotid artery and the internal carotid artery. The Envoy catheter would not advance over the microcatheter system. The synchro soft wire was removed and exchanged for a soft tipped Transcend wire. The diagnostic catheters would not advance over the micro system beyond the initial turns of the common carotid artery. All catheters and wires were then removed. Angiogram was performed at the right femoral artery access. No complications were encountered. FINDINGS: Ultrasound of the right common femoral artery demonstrates patent vessel with minimal atherosclerosis. Angiogram of the left common carotid artery origin: Internal carotid artery: Extensive tortuosity of the left common carotid artery, with the common origin from the innominate artery. The series of turns at the proximal left common carotid artery precluded placement of any diagnostic catheter, and the ability to access the intracranial vessels. MCA: Proximal middle cerebral artery with mild tortuosity and no significant atherosclerotic changes. Early temporal branch. Distal M1/ M2 branch of the left MCA. Minimal MCA operculum air and cortical branches filling, although the injection is incomplete given the site of the catheter tip. Anterior cerebral artery: Patent A1, with no cross-filling to the right. Leptomeningeal collateral vessels present within the watershed region.  IMPRESSION: Status post left cerebral angiogram demonstrating common origin of the left common carotid artery from the innominate artery as well as significantly tortuous proximal left ICA, precluding placement of catheter system into the MCA with inability to treat the M2 occlusion. Signed, Yvone Neu. Loreta Ave, DO [facility] Radiology PLAN: The right common femoral artery sheath access will be managed by vascular Interventional. Patient is stable to neuro ICU. Electronically Signed   By: Gilmer Mor D.O.   On: 10-12-16 05:25   Portable Chest Xray  Result Date: 10/12/2016 CLINICAL DATA:  Stroke EXAM: PORTABLE CHEST 1 VIEW COMPARISON:  04/22/2014 FINDINGS: Endotracheal tube tip is 2.6 cm from the carina. The heart is moderately enlarged. Normal vascularity. Low lung volumes. There is prominence of the superior mediastinum and right paratracheal regions which may reflect a central vessel crowding and low volumes. Atelectasis at the left mid and lower lung zones is noted. IMPRESSION: Low volumes with left lung central basilar atelectasis. Prominent mediastinum likely due to low volumes. Endotracheal tube. Cardiomegaly without decompensation. Electronically Signed   By: Jolaine Click M.D.   On: Oct 12, 2016 07:51   Ir Percutaneous Art Thrombectomy/infusion Intracranial Inc Diag Angio  Result Date: 12-Oct-2016 INDICATION: 83 year old male with a history of acute left MCA occlusion EXAM: ULTRASOUND GUIDED ACCESS  RIGHT COMMON FEMORAL ARTERY. LEFT CERVICAL AND CEREBRAL ANGIOGRAM FOR ATTEMPT AT LEFT MCA THROMBECTOMY COMPARISON:  CT and CT angiogram 09/05/2016 MEDICATIONS: 2.0 g Ancef. The antibiotic was administered within 1 hour of the procedure ANESTHESIA/SEDATION: General anesthesia CONTRAST:  75 cc Isovue 300 FLUOROSCOPY TIME:  Fluoroscopy Time: 48 minutes 24 seconds (1053 mGy). COMPLICATIONS: None TECHNIQUE: Informed written consent was obtained from the patient  after a thorough discussion of the procedural risks, benefits and alternatives. All questions were addressed. Maximal Sterile Barrier Technique was utilized including caps, mask, sterile gowns, sterile gloves, sterile drape, hand hygiene and skin antiseptic. A timeout was performed prior to the initiation of the procedure. PROCEDURE: Patient positioned supine position on the fluoroscopy table. The right inguinal region was prepped and draped in the usual sterile fashion. 1% lidocaine was used for local anesthesia at the puncture site. Ultrasound survey of the right inguinal region was performed with images stored and sent to PACs. A micropuncture needle was used access the right common femoral artery under ultrasound. With excellent arterial blood flow returned, and an .018 micro wire was passed through the needle, observed enter the abdominal aorta under fluoroscopy. The needle was removed, and a micropuncture sheath was placed over the wire. The inner dilator and wire were removed, and an 035 Bentson wire was advanced under fluoroscopy into the abdominal aorta. The sheath was removed and a standard 5 [place] vascular sheath was placed. The dilator was removed and the sheath was flushed. JB 1 catheter was advanced over the wire, with the wire and catheter combination placed into the proximal descending thoracic aorta. Wire was removed and double flush was performed. JB 1 catheter was advanced into the common origin of the innominate artery and left common carotid artery. With the catheter at the origin of the left common carotid artery, wire was advanced. Catheter would not advance into the common carotid artery. Catheter was then exchanged for a Sim 2 catheter, with catheter re- formed at the aortic arch. Sim 2 catheter was used to select the origin of left common carotid artery. The catheter would not advance beyond the series of turns at the proximal left common carotid artery for purchase. Advancing both  Glidewire and roadrunner wire would not allow placement of the catheter. Sim 2 catheter was then exchanged for a JB 2 catheter. Catheter would not advance into the common carotid artery over wire. Davis catheter was then selected. Days catheter would not advance it pass the series a turned to the proximal common carotid artery over a Glidewire or roadrunner wire. The 55 cm bright tip 8 French sheath was advanced into the aorta, with series if catheters again used to an attempt to select the common carotid artery with extra support from the sheath. No catheter was successful in advancing beyond the series of initial turns within the common carotid artery. The 55 cm bright tip sheath was then exchanged for an 8 French 70 cm angled flexor sheath. Again, no catheter was successful and advancing within the left common carotid artery. Six French envoy Simmons 2 catheter was then placed through the flexor sheath, unsuccessful a navigating beyond the initial turns. This catheter was removed and the 8 French 55 cm bright tip sheath was then again placed with the Envoy catheter used in an attempt to select the common carotid artery. The Envoy a catheter would not advance beyond the initial turns of the common carotid artery. Coaxial microcatheter system using a coaxial pro 18 catheter and synchro soft  were advanced into the common carotid artery and the internal carotid artery. The Envoy catheter would not advance over the microcatheter system. The synchro soft wire was removed and exchanged for a soft tipped Transcend wire. The diagnostic catheters would not advance over the micro system beyond the initial turns of the common carotid artery. All catheters and wires were then removed. Angiogram was performed at the right femoral artery access. No complications were encountered. FINDINGS: Ultrasound of the right common femoral artery demonstrates patent vessel with minimal atherosclerosis. Angiogram of the left common carotid  artery origin: Internal carotid artery: Extensive tortuosity of the left common carotid artery, with the common origin from the innominate artery. The series of turns at the proximal left common carotid artery precluded placement of any diagnostic catheter, and the ability to access the intracranial vessels. MCA: Proximal middle cerebral artery with mild tortuosity and no significant atherosclerotic changes. Early temporal branch. Distal M1/ M2 branch of the left MCA. Minimal MCA operculum air and cortical branches filling, although the injection is incomplete given the site of the catheter tip. Anterior cerebral artery: Patent A1, with no cross-filling to the right. Leptomeningeal collateral vessels present within the watershed region. IMPRESSION: Status post left cerebral angiogram demonstrating common origin of the left common carotid artery from the innominate artery as well as significantly tortuous proximal left ICA, precluding placement of catheter system into the MCA with inability to treat the M2 occlusion. Signed, Yvone Neu. Loreta Ave, DO [facility] Radiology PLAN: The right common femoral artery sheath access will be managed by vascular Interventional. Patient is stable to neuro ICU. Electronically Signed   By: Gilmer Mor D.O.   On: 10/12/2016 05:25   Ct Head Code Stroke W/o Cm  Addendum Date: 10/26/2016   ADDENDUM REPORT: Oct 05, 2016 02:31 ADDENDUM: Findings were discussed with Dr. Caryl Pina at approximately 2 a.m., via telephone. Electronically Signed   By: Deatra Robinson M.D.   On: Oct 05, 2016 02:31   Result Date: 10/24/2016 CLINICAL DATA:  Code stroke.  Right-sided facial droop EXAM: CT HEAD WITHOUT CONTRAST TECHNIQUE: Contiguous axial images were obtained from the base of the skull through the vertex without intravenous contrast. COMPARISON:  None. 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 No CT evidence of acute cortical infarct.  No hemorrhage or extra-axial collection. There is periventricular hypoattenuation compatible with chronic microvascular disease. There is chronic volume loss. Vascular: No hyperdense vessel sign. Atherosclerotic calcification of the vertebral and internal carotid arteries at the skull base. Skull: Normal visualized skull base, calvarium and extracranial soft tissues. Sinuses/Orbits: No sinus fluid levels or advanced mucosal thickening. No mastoid effusion. Normal orbits. ASPECTS [facility] Stroke Program Early CT Score) - Ganglionic level infarction (caudate, lentiform nuclei, internal capsule, insula, M1-M3 cortex): 7 - Supraganglionic infarction (M4-M6 cortex): 3 Total score (0-10 with 10 being normal): 10 IMPRESSION: 1. No acute intracranial hemorrhage. 2. Old right occipital infarct, chronic microvascular ischemia and cerebral atrophy. 3. ASPECTS is 10. Dr.  Caryl Pina was paged at 1:23 a.m. Electronically Signed: By: Deatra Robinson M.D. On: 10/03/2016 01:24    STUDIES: CT head 4/27 > no acute hemorrhage. CTA head 4/27 > occlusion of left MCA M2 segment, fusiform dilatation of anterior genu of right internal carotid artery. Echo 4/27 >  MRI brain 4/27 >   SIGNIFICANT EVENTS: 4/27 > admit, taken to IR but unsuccessful thrombectomy.  LINES / TUBES: Right CFA sheath 4/27 > ETT 4/27 > A-line left radial 4/27 >>  DISCUSSION:  83 y.o. M found down with right hemiplegia and aphasia, found to have left M2 CVA.  Taken to IR for attempt of thrombectomy; however, this was unsuccessful given pt anatomy (tortuous carotid artery).   ASSESSMENT / PLAN:  NEUROLOGIC A:  Acute L MCA CVA - s/p unsuccessful  thrombectomy secondary to patient anatomy (tortuous carotid artery). Acute encephalopathy - due to sedation. P: Stroke workup / management per neuro. Sedation: Propofol gtt / Fentanyl PRN. RASS Goal: 0 to -1. Daily WUA.  PULMONARY A: Respiratory insufficiency - remains on mechanical ventilation  following neuro IR attempt at revascularization. P:   Full vent support. Wean as able. Hopeful extubation after sheath removed. CXR in AM.  CARDIOVASCULAR A:  Troponin bump - suspect demand. Hx AFib (on Coumadin), HTN, sCHF (Echo from Nov 2015 with EF 40-45%, diffuse hypokinesis). P:  Trend troponin. Continue cleviprex for goal SBP < 180 (allow for permissive HTN) given ischemic stroke. Hold preadmission coumadin for now. Anticoagulation per neuro recs (? Heparin gtt). Assess echo. Hold preadmission benazepril, furosemide, verapamil, warfarin.  RENAL A:   AKI. Hypokalemia. P: Continue NS @ 50. K per tube. BMP in AM.  GASTROINTESTINAL A: GI Prophylaxis. Nutrition. P:   SUP: Pantoprazole. NPO. Swallow eval once extubated.  HEMATOLOGIC A:  VTE prophylaxis. P: SCD's. CBC in AM.  INFECTIOUS A: Mild leukocytosis - no indication of infection; presume acute phase reactant. P: Monitor clinically.   Family updates:  None available.  Interdisciplinary team meeting:  Due 5/3.  CC time: 30 min.   Rutherford Guys, [place] Sidonie Dickens Pulmonary [facility] Medicine Pager: [phone]  or [phone] Oct 16, 2016, 8:57 AM  STAFF NOTE: I, Rory Percy, MD FACP have personally reviewed patient's available data, including medical history, events of note, physical examination and test results as part of my evaluation. I have discussed with resident/NP and other care providers such as pharmacist, RN and RRT. In addition, I personally evaluated patient and elicited key findings of: not following commands, sedate don prop 40, perr 3 mm, moves left upper to purpose, min rt sided movement, lungs clear, jvd wnl, abdo soft, no murmur, afib s1 s2 ir not tachy, sts 177, CT reviewed and all imaging, presents out of window and with acute cva left mca unable to IR intervene, remain on vent now, plan reduce prop to 15-20 wean cpap 5 ps 5, goal 1 hour ,assess rsbi, he  appears to have gag/ cough and was NOT intubated for distress, pcxr overall with small ung volumes, maybe slight hint fluid fissure rt, keep even to slight pos, no lasix post contrast, upright as able after sheeth removal then likely extubation, cleviprex drip to neuro goals, consider permiossive to 220 , set now at 180, prop tolerrated well and BNP may rise when we reduce, no tf as likley to extubate, k supp, bme tin afternoon and in am , no free eater high risk brain edema, abg on rest reviewed, slight MV drop, no family at bedside   The patient is critically ill with multiple organ systems failure and requires high complexity decision making for assessment and support, frequent evaluation and titration of therapies, application of advanced monitoring technologies and extensive interpretation of multiple databases.   Critical Care Time devoted to patient care services described in this note is 30 Minutes. This time reflects time of care of this signee: Rory Percy, MD FACP. This critical care time does not reflect procedure time, or  teaching time or supervisory time of PA/NP/Med student/Med Resident etc but could involve care discussion time. Rest per NP/medical resident whose note is outlined above and that I agree with   Mcarthur Rossetti. Tyson Alias, MD, FACP Pgr: [phone] [facility] 09/06/2016 8:59 AM

## 2016-09-29 NOTE — Anesthesia Preprocedure Evaluation (Addendum)
Anesthesia Evaluation  Patient identified by MRN, date of birth, ID band Patient confused  Preop documentation limited or incomplete due to emergent nature of procedure.  Airway Mallampati: II  TM Distance: >3 FB     Dental   Pulmonary    breath sounds clear to auscultation       Cardiovascular hypertension, + dysrhythmias Atrial Fibrillation  Rhythm:Irregular Rate:Normal     Neuro/Psych    GI/Hepatic   Endo/Other    Renal/GU      Musculoskeletal   Abdominal   Peds  Hematology  (+) anemia ,   Anesthesia Other Findings   Reproductive/Obstetrics                            Lab Results  Component Value Date   WBC 12.7 (H) 10-22-16   HGB 13.2 10/22/16   HCT 40.4 22-Oct-2016   MCV 89.4 10-22-16   PLT 184 2016-10-22   Lab Results  Component Value Date   CREATININE 1.30 (H) 22-Oct-2016   CREATININE 1.44 (H) 22-Oct-2016   BUN 12 Oct 22, 2016   BUN 11 10-22-2016   NA 144 10-22-16   NA 142 2016-10-22   K 3.2 (L) 10/22/16   K 3.3 (L) Oct 22, 2016   CL 106 10/22/16   CL 108 2016/10/22   CO2 19 (L) 2016-10-22    Anesthesia Physical Anesthesia Plan  ASA: IV and emergent  Anesthesia Plan: General   Post-op Pain Management:    Induction: Intravenous  Airway Management Planned: Oral ETT  Additional Equipment: Arterial line  Intra-op Plan:   Post-operative Plan: Post-operative intubation/ventilation  Informed Consent: I have reviewed the patients History and Physical, chart, labs and discussed the procedure including the risks, benefits and alternatives for the proposed anesthesia with the patient or authorized representative who has indicated his/her understanding and acceptance.   Only emergency history available  Plan Discussed with: Anesthesiologist and Surgeon  Anesthesia Plan Comments:         Anesthesia Quick Evaluation

## 2016-09-29 NOTE — Progress Notes (Addendum)
STROKE TEAM PROGRESS NOTE   HISTORY OF PRESENT ILLNESS (per record) Ricky Guzman is an 83 y.o. male who presented with right hemiplegia and aphasia. He was LKN at 2230 and found by family with above symptoms at 0030. BP on EMS arrival was 196/102, with CBG 126. Code Stroke was initiated en route to the ED.  His PMHx includes atrial fibrillation and HTN. He is currently on warfarin.   LSN: [id number] tPA Given: No: Out of 3 hour IV tPA time window for patients > 81 years of age.    SUBJECTIVE (INTERVAL HISTORY) His two daughters are at the bedside.  Pt still intubated and not following commands on propofol, but moving all extremities on pain stimulation. Daughter stated that pt was on eliquis 2.5mg  bid PTA.    OBJECTIVE Temp:  [98.6 F (37 C)-99.5 F (37.5 C)] 99.5 F (37.5 C) (04/27 0855) Pulse Rate:  [43-81] 57 (04/27 0851) Resp:  [14-21] 16 (04/27 0851) BP: (138-203)/(67-114) 143/67 (04/27 0851) SpO2:  [93 %-100 %] 99 % (04/27 0900) Arterial Line BP: (171-195)/(79-96) 171/80 (04/27 0745) FiO2 (%):  [40 %] 40 % (04/27 0851) Weight:  [91.1 kg (200 lb 13.4 oz)] 91.1 kg (200 lb 13.4 oz) (04/27 0115)  CBC:   Recent Labs Lab 10/06/2016 0118 2016-10-06 0236  WBC  --  12.7*  NEUTROABS  --  10.4*  HGB 13.9 13.2  HCT 41.0 40.4  MCV  --  89.4  PLT  --  184    Basic Metabolic Panel:   Recent Labs Lab Oct 06, 2016 0118 10-06-16 0630  NA 142  144  --   K 3.3*  3.2*  --   CL 108  106  --   CO2 19*  --   GLUCOSE 121*  126*  --   BUN 11  12  --   CREATININE 1.44*  1.30*  --   CALCIUM 8.8*  --   MG  --  1.9  PHOS  --  1.6*    Lipid Panel:     Component Value Date/Time   CHOL 137 10/06/16 0545   TRIG 118 06-Oct-2016 0545   HDL 33 (L) 10-06-2016 0545   CHOLHDL 4.2 10-06-2016 0545   VLDL 24 10-06-2016 0545   LDLCALC 80 2016-10-06 0545   HgbA1c: No results found for: HGBA1C Urine Drug Screen: No results found for: LABOPIA, COCAINSCRNUR, LABBENZ, AMPHETMU, THCU, LABBARB   Alcohol Level No results found for: [facility]   IMAGING I have personally reviewed the radiological images below and agree with the radiology interpretations.  Ct Angio Head W Or Wo Contrast Ct Angio Neck W And/or Wo Contrast 06-Oct-2016 1. 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 Attenuated distal left MCA distribution vascularity.  2. Fusiform dilatation of the anterior genu of the Rt ICA cavernous segment, measuring 8 mm.  3. Normal cervical CTA.  4. Aortic atherosclerosis.   Ir Percutaneous Art Thrombectomy/infusion Intracranial Inc Diag Angio Ir US Guide Vasc Access Left  Oct 06, 2016 Status post left cerebral angiogram demonstrating common origin of the left common carotid artery from the innominate artery as well as significantly tortuous proximal left ICA, precluding placement of catheter system into the MCA with inability to treat the M2 occlusion.  PLAN: The right common femoral artery sheath access will be managed by vascular Interventional. Patient is stable to neuro ICU.   Portable Chest Xray 10/06/2016 Low volumes with left lung central basilar atelectasis. Prominent mediastinum  likely due to low volumes. Endotracheal tube. Cardiomegaly without decompensation.    Ct Head Code Stroke W/o Cm 09/15/2016   1. No acute intracranial hemorrhage.  2. Old right occipital infarct, chronic microvascular ischemia and cerebral atrophy.  3. ASPECTS is 10.   TTE pending  MRI and MRA pending   PHYSICAL EXAM  Temp:  [98.6 F (37 C)-99.5 F (37.5 C)] 99.5 F (37.5 C) (04/27 0855) Pulse Rate:  [43-81] 57 (04/27 0851) Resp:  [14-21] 16 (04/27 0851) BP: (138-203)/(67-114) 143/67 (04/27 0851) SpO2:  [93 %-100 %] 99 % (04/27 0900) Arterial Line BP: (171-195)/(79-96) 171/80 (04/27 0745) FiO2 (%):  [40 %] 40 % (04/27 0851) Weight:  [200 lb 13.4 oz (91.1 kg)] 200 lb 13.4 oz (91.1 kg) (04/27 0115)  General - Well nourished,  well developed, intubated on sedation.  Ophthalmologic - Fundi not visualized.  Cardiovascular - irregularly irregular heart rate and rhythm.  Neuro - intubated on sedation, barely open eyes on voice and pain, not following commands. Left eye gaze, PERRL, doll's eyes positive, not blinking to visual threat with forced eye opening, not tracking. Right corneal absent, but left corneal and gag positive. On pain stimulation, withdraw all extremities equally, at least 2+/5. DTR 1+ and bilateral babinski positive. sensation, coordination and gait not tested.    ASSESSMENT/PLAN [name] is a 83 y.o. male with history of small bowel obstruction, rectal bleeding, hypertension, atrial fibrillation (warfarin), INR 1.18 on admission, and old stroke by imaging presenting with right hemiplegia and aphasia. He did not receive IV t-PA due to anticoagulation. Attempted but unsuccessful thrombectomy.  Stroke:  Left MCA infarct with M1/M2 junction occlusion - embolic - secondary to afib with under-dosed eliquis  Resultant  Intubated on sedation  MRI  - pending  MRA -  pending  CTA head and neck - left M1/M2 occlusion, right ICA cavernous fusiform dilation    IR attempted but aborted due to tortuosity of the left ICA  2D Echo - pending  LDL - 80  HgbA1c - pending  VTE prophylaxis - SCDs Diet NPO time specified  eliquis 2.5mg  bid prior to admission, now on aspirin 300 mg suppository daily.    Patient counseled to be compliant with his antithrombotic medications  Ongoing aggressive stroke risk factor management  Therapy recommendations: pending  Disposition: Pending  afib on eliquis  Pt eliquis 2.5mg  bid PTA was under dosed as pt weight > 60Kg and Cre < 1.5.   Hold off anticoagulation for now pending MRI to evaluate infarct size  On ASA  PR  Consider resume eliquis  bid once appropirate.  Respiratory failure  Intubated for intervention  CCM on board  Evaluating  possible extubation after sheath removal.   Hypertension  Stable  Permissive hypertension (OK if < 180/105 due to groin sheath) but gradually normalize in 5-7 days  Long-term BP goal 130-150 due to left MCA M1/M2 occlusion  Hyperlipidemia  Home meds: No lipid lowering medications prior to admission  LDL 80, goal < 70  Add low-dose statin when PO access is available  Continue statin at discharge  Other Stroke Risk Factors  Advanced age  ETOH use, advised to drink no more than 1drink per day  Obesity, Body mass index is 32.42 kg/m., recommend weight loss, diet and exercise as appropriate   Hx stroke/TIA - by imaging  Other Active Problems  Fusiform dilatation of the anterior genu of the Rt ICA cavernous segment, measuring 8 mm.  Mild leukocytosis - 12.7 -  oral temp 99.5  Hypokalemia - 3.2 - supplemented  Hypophosphatemia - 1.6  Elevated creatinine - 1.30->1.44  Hospital day # 0  This patient is critically ill due to left MCA infarct, left MCA occlusion, afib and intubated and at significant risk of neurological worsening, death form recurrent stroke, hemorrhagic transformation, heart failure, cerebral edema and seizure. This patient's care requires constant monitoring of vital signs, hemodynamics, respiratory and cardiac monitoring, review of multiple databases, neurological assessment, discussion with family, other specialists and medical decision making of high complexity. I spent 40 minutes of neurocritical care time in the care of this patient. I had long discussion with daughters at bedside, updated pt condition, treatment plan, imaging repeat and answered all their questions.   Marvel Plan, MD PhD Stroke Neurology 09/10/2016 11:23 AM  To contact Stroke Continuity provider, please refer to [email or web address]. After hours, contact General Neurology

## 2016-09-29 NOTE — ED Notes (Signed)
cbg 119 

## 2016-09-29 NOTE — ED Triage Notes (Signed)
Pt LSN by family at 2230 fond at 75. Found with R sided weakness and R sided facial droop, aphasic, and nonverbal. Hypertensive with EMS 196/102 CBG 126

## 2016-09-29 NOTE — Progress Notes (Cosign Needed)
Patient ID: Ricky Guzman, male   DOB: 1931/01/13, 83 y.o.   MRN: [id number]  4/26: Procedure:  US guided right CFA access.  Left cerebral angiogram.  Findings:  Tortuous arch vessels and tortuous carotid artery precluded delivery of sheath system into the carotid artery, and no treatment could be initiated  Right groin sheath intact No bleeding No hematoma Rt foot 1+ pulse---used doppler to be sure.  For groin sheath removal today

## 2016-09-29 NOTE — Progress Notes (Signed)
Initial Nutrition Assessment  DOCUMENTATION CODES:   Obesity unspecified  INTERVENTION:   If pt remains intubated recommend: Replete phosphorus  Vital High Protein @ 10 ml/hr (240 ml/day) 60 ml Prostat QID MVI daily Provides: 1040 kcal, 141 grams protein, and 200 ml free water TF regimen and propofol at current rate providing 1401 total kcal/day   NUTRITION DIAGNOSIS:   Inadequate oral intake related to inability to eat as evidenced by NPO status.  GOAL:   Provide needs based on ASPEN/SCCM guidelines  MONITOR:   Vent status, I & O's  REASON FOR ASSESSMENT:   Ventilator    ASSESSMENT:   Pt with hx of HTN and a.fib who was admitted with occlusion of L MCA thrombectomy unsuccessful due to anatomy.    Pt discussed during ICU rounds and with RN.   Remains intubated. No family present.   Propofol @ 13.7 ml/hr provides: 361 kcal Phosphorus 1.6, Magnesium 1.9 No findings during nutrition-focused physical exam  Diet Order:  Diet NPO time specified  Skin:  Reviewed, no issues  Last BM:  unknown  Height:   Ht Readings from Last 1 Encounters:  10/11/16  (1.676 m)    Weight:   Wt Readings from Last 1 Encounters:  10-11-16 200 lb 13.4 oz (91.1 kg)    Ideal Body Weight:  64.5 kg  BMI:  Body mass index is 32.42 kg/m.  Estimated Nutritional Needs:   Kcal:  1610-9604  Protein:  >/= 129 grams  Fluid:  2 L/day  EDUCATION NEEDS:   No education needs identified at this time  Kendell Bane RD, LDN, CNSC [phone] Pager [phone] After Hours Pager

## 2016-09-29 NOTE — Transfer of Care (Signed)
Immediate Anesthesia Transfer of Care Note  Patient: Ricky Guzman  Procedure(s) Performed: Procedure(s): RADIOLOGY WITH ANESTHESIA (N/A)  Patient Location: ICU  Anesthesia Type:General  Level of Consciousness: Patient remains intubated per anesthesia plan  Airway & Oxygen Therapy: Patient remains intubated per anesthesia plan and Patient placed on Ventilator (see vital sign flow sheet for setting)  Post-op Assessment: Report given to RN and Post -op Vital signs reviewed and stable  Post vital signs: Reviewed and stable  Last Vitals:  Vitals:   10/02/2016 0154  BP: (!) 188/114  Pulse: 81  Resp: (!) 21  Temp: 37 C    Last Pain:  Vitals:   09/28/2016 0154  TempSrc: Oral         Complications: No apparent anesthesia complications

## 2016-09-29 NOTE — Progress Notes (Signed)
SLP Cancellation Note  Patient Details Name: Ricky Guzman MRN: [id number] DOB: [birth year]   Cancelled treatment:        Pt is intubated. Will follow.    Royce Macadamia 10/02/2016, 8:13 AM  Breck Coons Lonell Face.Ed [employer] [phone]

## 2016-09-29 NOTE — ED Notes (Signed)
Pt transferred to IR, belongings with family

## 2016-09-29 NOTE — Progress Notes (Signed)
PT Cancellation Note  Patient Details Name: Atilano Covelli MRN: [id number] DOB: 02-09-1931   Cancelled Treatment:    Reason Eval/Treat Not Completed: Patient not medically ready Pt on Bedrest. Please update activity orders when approprate for therapy. Thanks   Gearline Spilman A Amando Chaput 09/19/2016, 8:21 AM Mylo Red, PT, DPT [phone]
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
## 2016-09-30 ENCOUNTER — Inpatient Hospital Stay (HOSPITAL_COMMUNITY): Payer: Medicare Other

## 2016-09-30 LAB — BASIC METABOLIC PANEL
ANION GAP: 9 (ref 5–15)
ANION GAP: 9 (ref 5–15)
BUN: 6 mg/dL (ref 6–20)
BUN: 6 mg/dL (ref 6–20)
CALCIUM: 8 mg/dL — AB (ref 8.9–10.3)
CALCIUM: 8 mg/dL — AB (ref 8.9–10.3)
CO2: 23 mmol/L (ref 22–32)
CO2: 21 mmol/L — ABNORMAL LOW (ref 22–32)
Chloride: 108 mmol/L (ref 101–111)
Chloride: 112 mmol/L — ABNORMAL HIGH (ref 101–111)
Creatinine, Ser: 0.95 mg/dL (ref 0.61–1.24)
Creatinine, Ser: 0.82 mg/dL (ref 0.61–1.24)
GFR calc Af Amer: >60 mL/min (ref >60)
GFR calc Af Amer: >60 mL/min (ref >60)
GFR calc non Af Amer: >60 mL/min (ref >60)
GLUCOSE: 125 mg/dL — AB (ref 65–99)
GLUCOSE: 113 mg/dL — AB (ref 65–99)
Potassium: 3 mmol/L — ABNORMAL LOW (ref 3.5–5.1)
Potassium: 4.4 mmol/L (ref 3.5–5.1)
SODIUM: 140 mmol/L (ref 135–145)
SODIUM: 142 mmol/L (ref 135–145)

## 2016-09-30 LAB — CBC WITH DIFFERENTIAL/PLATELET
Basophils Absolute: 0 10*3/uL (ref 0.0–0.1)
Basophils Relative: 0 %
Eosinophils Absolute: 0.1 10*3/uL (ref 0.0–0.7)
Eosinophils Relative: 1 %
HCT: 36.5 % — ABNORMAL LOW (ref 39.0–52.0)
Hemoglobin: 12.1 g/dL — ABNORMAL LOW (ref 13.0–17.0)
Lymphocytes Relative: 11 %
Lymphs Abs: 1.1 10*3/uL (ref 0.7–4.0)
MCH: 29.1 pg (ref 26.0–34.0)
MCHC: 33.2 g/dL (ref 30.0–36.0)
MCV: 87.7 fL (ref 78.0–100.0)
Monocytes Absolute: 1.1 10*3/uL — ABNORMAL HIGH (ref 0.1–1.0)
Monocytes Relative: 11 %
Neutro Abs: 7.7 10*3/uL (ref 1.7–7.7)
Neutrophils Relative %: 77 %
Platelets: 161 10*3/uL (ref 150–400)
RBC: 4.16 MIL/uL — ABNORMAL LOW (ref 4.22–5.81)
RDW: 14.2 % (ref 11.5–15.5)
WBC: 10 10*3/uL (ref 4.0–10.5)

## 2016-09-30 LAB — MAGNESIUM: Magnesium: 1.8 mg/dL (ref 1.7–2.4)

## 2016-09-30 LAB — HEMOGLOBIN A1C
Hgb A1c MFr Bld: 5.8 % — ABNORMAL HIGH (ref 4.8–5.6)
Mean Plasma Glucose: 120 mg/dL

## 2016-09-30 LAB — PHOSPHORUS: Phosphorus: 2.7 mg/dL (ref 2.5–4.6)

## 2016-09-30 MED ORDER — MAGNESIUM SULFATE 2 GM/50ML IV SOLN
2.0000 g | Freq: Once | INTRAVENOUS | Status: AC
  Administered 2016-09-30: 2 g via INTRAVENOUS
  Filled 2016-09-30: qty 50

## 2016-09-30 MED ORDER — POLYETHYLENE GLYCOL 3350 17 G PO PACK
17.0000 g | PACK | Freq: Every day | ORAL | Status: DC
  Administered 2016-09-30 – 2016-10-03 (×3): 17 g
  Filled 2016-09-30 (×3): qty 1

## 2016-09-30 MED ORDER — PRAVASTATIN SODIUM 20 MG PO TABS
20.0000 mg | ORAL_TABLET | Freq: Every day | ORAL | Status: DC
  Administered 2016-10-01 – 2016-10-02 (×2): 20 mg
  Filled 2016-09-30 (×2): qty 1

## 2016-09-30 MED ORDER — NICARDIPINE HCL IN NACL 20-0.86 MG/200ML-% IV SOLN: 0.0000 mg/h | INTRAVENOUS | Status: DC

## 2016-09-30 MED ORDER — POTASSIUM CHLORIDE 20 MEQ/15ML (10%) PO SOLN
20.0000 meq | Freq: Three times a day (TID) | ORAL | Status: DC
  Administered 2016-09-30 (×2): 20 meq
  Filled 2016-09-30 (×2): qty 15

## 2016-09-30 MED ORDER — POTASSIUM CHLORIDE 2 MEQ/ML IV SOLN
Freq: Once | INTRAVENOUS | Status: AC
  Administered 2016-09-30: 07:00:00 via INTRAVENOUS
  Filled 2016-09-30: qty 1000

## 2016-09-30 MED ORDER — VITAMIN D 1000 UNITS PO TABS
1000.0000 [IU] | ORAL_TABLET | Freq: Every day | ORAL | Status: DC
  Administered 2016-09-30 – 2016-10-03 (×3): 1000 [IU] via ORAL
  Filled 2016-09-30 (×3): qty 1

## 2016-09-30 NOTE — Progress Notes (Signed)
SLP Cancellation Note  Patient Details Name: Ricky Guzman MRN: [id number] DOB: [birth year]   Cancelled treatment:       Reason Eval/Treat Not Completed: Medical issues which prohibited therapy. Pt remains on vent. SLP will f/u as appropriate.  Rondel Baton, [place] CF-SLP Speech-Language Pathologist [phone]   Ricky Guzman 09/30/2016, 10:24 AM

## 2016-09-30 NOTE — Progress Notes (Signed)
eLink Physician-Brief Progress Note Patient Name: Ricky Guzman DOB: 1930-09-04 MRN: [id number]   Date of Service  09/30/2016  HPI/Events of Note  hypokalemia  eICU Interventions  Potassium replaced     Intervention Category Intermediate Interventions: Electrolyte abnormality - evaluation and management  Starasia Sinko 09/30/2016, 6:11 AM

## 2016-09-30 NOTE — Progress Notes (Signed)
STROKE TEAM PROGRESS NOTE   HISTORY OF PRESENT ILLNESS (per record) Ricky Guzman is an 83 y.o. male who presented with right hemiplegia and aphasia. He was LKN at 2230 and found by family with above symptoms at 0030. BP on EMS arrival was 196/102, with CBG 126. Code Stroke was initiated en route to the ED.  His PMHx includes atrial fibrillation and HTN. On Eliquis 2.5 Mg BID PTA - under dosed.  LSN: 2230 tPA Given: No: Out of 3 hour IV tPA time window for patients > 17 years of age.    SUBJECTIVE (INTERVAL HISTORY) His family is not  at the bedside.  Pt still intubated and not following commands on propofol, but moving all extremities on pain stimulation,Rt  groin sheath removed today  OBJECTIVE Temp:  [97.5 F (36.4 C)-99.5 F (37.5 C)] 98.6 F (37 C) (04/28 0800) Pulse Rate:  [37-125] 87 (04/28 0800) Cardiac Rhythm: Atrial fibrillation (04/28 0800) Resp:  [12-24] 18 (04/28 0800) BP: (101-186)/(63-113) 129/78 (04/28 0800) SpO2:  [98 %-100 %] 100 % (04/28 0800) Arterial Line BP: (107-193)/(59-101) 141/80 (04/28 0800) FiO2 (%):  [40 %] 40 % (04/28 0742)  CBC:   Recent Labs Lab 09/12/2016 0236 09/30/16 0510  WBC 12.7* 10.0  NEUTROABS 10.4* 7.7  HGB 13.2 12.1*  HCT 40.4 36.5*  MCV 89.4 87.7  PLT 184 161    Basic Metabolic Panel:   Recent Labs Lab 09/28/2016 0630 09/17/2016 1750 09/30/16 0510  NA  --  140 140  K  --  3.4* 3.0*  CL  --  109 108  CO2  --  22 23  GLUCOSE  --  120* 125*  BUN  --  6 6  CREATININE  --  0.89 0.95  CALCIUM  --  7.9* 8.0*  MG 1.9  --  1.8  PHOS 1.6*  --  2.7    Lipid Panel:     Component Value Date/Time   CHOL 137 10/02/2016 0545   TRIG 118 09/24/2016 0545   HDL 33 (L) 09/16/2016 0545   CHOLHDL 4.2 09/16/2016 0545   VLDL 24 09/12/2016 0545   LDLCALC 80 09/21/2016 0545   HgbA1c:  Lab Results  Component Value Date   HGBA1C 5.8 (H) 09/11/2016   Urine Drug Screen: No results found for: LABOPIA, COCAINSCRNUR, LABBENZ, AMPHETMU,  THCU, LABBARB  Alcohol Level No results found for: [facility]   IMAGING I have personally reviewed the radiological images below and agree with the radiology interpretations.  Ct Angio Head W Or Wo Contrast Ct Angio Neck W And/or Wo Contrast 09/05/2016 1. 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 Attenuated distal left MCA distribution vascularity.  2. Fusiform dilatation of the anterior genu of the Rt ICA cavernous segment, measuring 8 mm.  3. Normal cervical CTA.  4. Aortic atherosclerosis.   Ir Percutaneous Art Thrombectomy/infusion Intracranial Inc Diag Angio Ir US Guide Vasc Access Left  09/15/2016 Status post left cerebral angiogram demonstrating common origin of the left common carotid artery from the innominate artery as well as significantly tortuous proximal left ICA, precluding placement of catheter system into the MCA with inability to treat the M2 occlusion.     Portable Chest Xray 09/19/2016 Low volumes with left lung central basilar atelectasis. Prominent mediastinum likely due to low volumes. Endotracheal tube. Cardiomegaly without decompensation.    Ct Head Code Stroke W/o Cm 10/01/2016   1. No acute intracranial hemorrhage.  2. Old  right occipital infarct, chronic microvascular ischemia and cerebral atrophy.  3. ASPECTS is 10.   TTE pending  MRI and MRA pending   PHYSICAL EXAM  Temp:  [97.5 F (36.4 C)-99.5 F (37.5 C)] 98.6 F (37 C) (04/28 0800) Pulse Rate:  [37-125] 87 (04/28 0800) Resp:  [12-24] 18 (04/28 0800) BP: (101-186)/(63-113) 129/78 (04/28 0800) SpO2:  [98 %-100 %] 100 % (04/28 0800) Arterial Line BP: (107-193)/(59-101) 141/80 (04/28 0800) FiO2 (%):  [40 %] 40 % (04/28 0742)  General - Well nourished, well developed, intubated on sedation.  Ophthalmologic - Fundi not visualized.  Cardiovascular - irregularly irregular heart rate and rhythm.  Neuro - intubated on sedation,  barely open eyes on voice and pain, not following commands. Left eye gaze, PERRL, doll's eyes positive, not blinking to visual threat with forced eye opening, not tracking. Right corneal absent, but left corneal and gag positive. On pain stimulation, withdraw all extremities equally, at least 2+/5. DTR 1+ and bilateral babinski positive. sensation, coordination and gait not tested.    ASSESSMENT/PLAN [name] is a 83 y.o. male with history of small bowel obstruction, rectal bleeding, hypertension, atrial fibrillation (Eliquis - under dosed), and old stroke by imaging presenting with right hemiplegia and aphasia. He did not receive IV t-PA due to late presentation. Attempted but unsuccessful thrombectomy.  Stroke:  Left MCA infarct with M1/M2 junction occlusion - embolic - secondary to afib with under-dosed eliquis  Resultant  Intubated on sedation  MRI  - pending  MRA -  pending  CTA head and neck - left M1/M2 occlusion, right ICA cavernous fusiform dilation    IR attempted but aborted due to tortuosity of the left ICA  2D Echo - pending  LDL - 80  HgbA1c - 5.8  VTE prophylaxis - SCDs Diet NPO time specified  eliquis 2.5mg  bid prior to admission, now on aspirin 325 mg daily.  Patient counseled to be compliant with his antithrombotic medications  Ongoing aggressive stroke risk factor management  Therapy recommendations: pending  Disposition: Pending  afib on eliquis  Pt eliquis 2.5mg  bid PTA was under dosed as pt weight > 60Kg and Cre < 1.5.   Hold off anticoagulation for now pending MRI to evaluate infarct size  On ASA  PR  Consider resuming Eliquis  bid once appropirate.  Respiratory failure  Intubated for intervention  CCM on board  Evaluating possible extubation after sheath removal.   Hypertension  Stable  Permissive hypertension (OK if < 180/105 due to groin sheath) but gradually normalize in 5-7 days  Long-term BP goal 130-150 due to  left MCA M1/M2 occlusion  Hyperlipidemia  Home meds: No lipid lowering medications prior to admission  LDL 80, goal < 70  Start Pravachol 20 mg daily  Continue statin at discharge  Other Stroke Risk Factors  Advanced age  ETOH use, advised to drink no more than 1drink per day  Obesity, Body mass index is 32.42 kg/m., recommend weight loss, diet and exercise as appropriate   Hx stroke/TIA - by imaging  Atrial fib  Other Active Problems  Fusiform dilatation of the anterior genu of the Rt ICA cavernous segment, measuring 8 mm.  Mild leukocytosis - 12.7 -> 10.0 oral temp 98.6  Hypokalemia - 3.2 -> 3.4 -> 3.0 -> supplement -> recheck in AM  Hypophosphatemia - 1.6  Elevated creatinine - 1.30 ->1.44 -> 0.89 -> 0.95  NPO -> NG tube  Intubated  Hospital day # 1 Plan check MRI and  if no significant edema or shify may extubate if tolerated. Stroke w/u pending. No family available at bedside. This patient is critically ill due to left MCA infarct, left MCA occlusion, afib and intubated and at significant risk of neurological worsening, death form recurrent stroke, hemorrhagic transformation, heart failure, cerebral edema and seizure. This patient's care requires constant monitoring of vital signs, hemodynamics, respiratory and cardiac monitoring, review of multiple databases, neurological assessment, discussion with family, other specialists and medical decision making of high complexity. I spent 40 minutes of neurocritical care time in the care of this patient.   Delia Heady, MD  [facility] [address] [place], [place] [address]  Phone [phone] Fax [phone] To contact Stroke Continuity provider, please refer to [email or web address]. After hours, contact General Neurology

## 2016-09-30 NOTE — Progress Notes (Signed)
PULMONARY / CRITICAL CARE MEDICINE   Name: Ricky Guzman MRN: [id number] DOB: 06/30/1930    ADMISSION DATE:  09/12/2016 CONSULTATION DATE:  09/03/2016  REFERRING MD:  Otelia Limes  CHIEF COMPLAINT:  AMS  HISTORY OF PRESENT ILLNESS:  Pt is encephelopathic; therefore, this HPI is obtained from chart review. Ricky Guzman is a 83 y.o. male with PMH as outlined below.  He was found at 0030 4/27 with R-sided weakness and R-sided facial droop in addition to being aphasic and non-verbal. LSN by family at 2230 the night prior. Family denies any head injury or trauma and felt that pt was in his usual state of health the night prior. CTA with occlusion of left MCA M2-segment. Seen by neurology and IR who attempted percutaneous left MCA thrombectomy however was unsuccessful 2/2 patient anatomy. He then returned to ICU on vent and PCCM asked to assist with vent management.  SUBJECTIVE:   Sedated on full vent support.  VITAL SIGNS: BP 125/74   Pulse 79   Temp 98 F (36.7 C) (Axillary)   Resp 12   Ht  (1.676 m)   Wt 200 lb 13.4 oz (91.1 kg)   SpO2 100%   BMI 32.42 kg/m   HEMODYNAMICS:    VENTILATOR SETTINGS: Vent Mode: PRVC FiO2 (%):  [40 %] 40 % Set Rate:  [12 bmp-16 bmp] 12 bmp Vt Set:  [510 mL] 510 mL PEEP:  [5 cmH20] 5 cmH20 Plateau Pressure:  [16 cmH20-17 cmH20] 16 cmH20  INTAKE / OUTPUT: I/O last 3 completed shifts: In: 3041.2 [I.V.:3041.2] Out: 4300 [Urine:4200; Blood:100]   PHYSICAL EXAMINATION: General: Elderly obese male supine in bed on vent and sedated Neuro: Sedated, non-responsive, withdraws extremities to pain x 4 HEENT: [place]/AT. PERRL, sclerae anicteric MM pink and moist Cardiovascular: IRIR, no M/R/G.  Lungs: Respirations even and unlabored.  Clear throughout, no wheeze, rhonchi. Diminished per bases Abdomen: BS x 4, soft, Obese, non-tender  Musculoskeletal: No gross deformities, no edema.  Skin: Intact, warm, no rashes, no lesions noted  LABS:  BMET  Recent  Labs Lab 09/10/2016 0118 10/02/2016 1750 09/30/16 0510  NA 142  144 140 140  K 3.3*  3.2* 3.4* 3.0*  CL 108  106 109 108  CO2 19* 22 23  BUN CREATININE 1.44*  1.30* 0.89 0.95  GLUCOSE 121*  126* 120* 125*    Electrolytes  Recent Labs Lab 10/01/2016 0118 09/09/2016 0630 09/07/2016 1750 09/30/16 0510  CALCIUM 8.8*  --  7.9* 8.0*  MG  --  1.9  --  1.8  PHOS  --  1.6*  --  2.7    CBC  Recent Labs Lab 09/08/2016 0118 09/13/2016 0236 09/30/16 0510  WBC  --  12.7* 10.0  HGB 13.9 13.2 12.1*  HCT 41.0 40.4 36.5*  PLT  --  184 161    Coag's  Recent Labs Lab 09/25/2016 0118  APTT 32  INR 1.18    Sepsis Markers No results for input(s): LATICACIDVEN, PROCALCITON, O2SATVEN in the last 168 hours.  ABG  Recent Labs Lab 09/08/2016 0630  PHART 7.473*  PCO2ART 28.5*  PO2ART 162*    Liver Enzymes  Recent Labs Lab 09/16/2016 0118  AST 30  ALT 10*  ALKPHOS 61  BILITOT 0.6  ALBUMIN 3.9    Cardiac Enzymes  Recent Labs Lab 09/16/2016 0853 09/27/2016 1750  TROPONINI 0.18* 0.21*    Glucose  Recent Labs Lab 09/11/2016 0108  GLUCAP 119*    Imaging  Dg Abd Portable 1v  Result Date: 10-19-16 CLINICAL DATA:  Orogastric tube placement. EXAM: PORTABLE ABDOMEN - 1 VIEW COMPARISON:  Portable exam 1359 hours FINDINGS: Orogastric tube coiled in proximal stomach. Bowel gas pattern normal. Osseous structures unremarkable. Cardiac silhouette appears enlarged. Probable atelectasis in LEFT lower lobe. IMPRESSION: Orogastric tube coiled in proximal stomach. Electronically Signed   By: Ulyses Southward M.D.   On: Oct 19, 2016 14:25    STUDIES: CT head 4/27 > no acute hemorrhage. CTA head 4/27 > occlusion of left MCA M2 segment, fusiform dilatation of anterior genu of right internal carotid artery. Echo 4/28 >  MRI brain 4/28 >   SIGNIFICANT EVENTS: 4/27 > admit, taken to IR but unsuccessful thrombectomy.  LINES / TUBES: Right CFA sheath 4/27 > 4/28 ETT 4/27 > A-line  left radial 4/27 >>  DISCUSSION:  83 y.o. M found down with right hemiplegia and aphasia, found to have left M2 CVA.  Taken to IR for attempt of thrombectomy; however, this was unsuccessful given pt anatomy (tortuous carotid artery).Returned to ICU vented on full support overnight .Sheath remains in 4/28, but to be d/c'd this am.Plan to initiate weaning from vent at that point.   ASSESSMENT / PLAN:  NEUROLOGIC A:  Acute L MCA CVA - s/p unsuccessful  thrombectomy secondary to patient anatomy (tortuous carotid artery). Acute encephalopathy - due to sedation. P: Stroke workup / management per neuro. Sedation: Propofol gtt / Fentanyl PRN. RASS Goal: 0 to -1. Daily WUA. MRI today  PULMONARY A: Respiratory insufficiency - remains on mechanical ventilation following neuro IR attempt at revascularization. P:   Full vent support. SBT/Wean as able once sheath is removed.. reduce prop to 15-20 wean cpap 5 ps 5, goal 1 hour ,assess rsbi,  CXR in 4/29. Titrate oxygen to maintain saturations> 94% VAP bundle while intubated  CARDIOVASCULAR A:  Troponin bump - suspect demand. Hx AFib (on Coumadin), HTN, sCHF (Echo from Nov 2015 with EF 40-45%, diffuse hypokinesis). P:  Trend troponin. Continue cleviprex for goal SBP < 180 (allow for permissive HTN) given ischemic stroke. Continue to hold preadmission coumadin for now. Anticoagulation per neuro recs (? Heparin gtt). Assess echo.>> not completed  4/28 am Hold preadmission benazepril, furosemide, verapamil, warfarin.  RENAL A:   AKI. Hypokalemia. P: Continue NS @ 50. Replete electrolytes as needed Avoid nephrotoxic medications BMP at 1500 and in AM. Maintain renal perfusion Strict I&O  GASTROINTESTINAL A: GI Prophylaxis. Nutrition. P:   SUP: Pantoprazole. NPO. Swallow eval once extubated. Will need TF if does not wean and extubate quickly.  HEMATOLOGIC A:  VTE prophylaxis. Slight anemia P: SCD's. CBC in AM. Monitor  for any source bleeding Transfuse for HGB <7  INFECTIOUS A: Mild leukocytosis - no indication of infection; presume acute phase reactant. P: Monitor clinically. CBC in am Follow fever and WBC curve Culture for fever or any clinical change   Family updates:  No family at bedside.  Interdisciplinary team meeting:  Due 5/3.  CC APP time: 30 min.   Bevelyn Ngo, AGACNP-BC [facility] Pager:  [phone] 09/30/2016, 7:22 AM

## 2016-09-30 NOTE — Progress Notes (Addendum)
921am 9 fr rt femoral artery sheath removed using the 75fr exoseal device.  Pressure held for 15 minutes. Hemostasis obtained 0945 at distal pulses intact and site reviewed with RN.

## 2016-09-30 NOTE — Progress Notes (Signed)
PT Cancellation Note  Patient Details Name: Ricky Guzman MRN: [id number] DOB: 1931-06-04   Cancelled Treatment:    Reason Eval/Treat Not Completed: Patient not medically ready; remains on vent and just s/p sheath removal this am.  Will check back next date.   Elray Mcgregor 09/30/2016, 1:11 PM Sheran Lawless, [place] [phone] 09/30/2016

## 2016-09-30 NOTE — Progress Notes (Addendum)
STAFF NOTE: I, Dr Lavinia Sharps have personally reviewed patient's available data, including medical history, events of note, physical examination and test results as part of my evaluation. I have discussed with resident/NP and other care providers such as pharmacist, RN and RRT.  In addition,  I personally evaluated patient and elicited key findings of   S: sedated on diprivan. On cleviprex for SBP < 180. Wakes up on WUA per RN  O: in reverse trendelenburg abd soft RASS -3 on diprivan Sync with vent CTA bilaterally Normal heart sounds   Recent Labs Lab 27-Oct-2016 0118 10/27/16 0236 09/30/16 0510  HGB 13.9 13.2 12.1*  HCT 41.0 40.4 36.5*  WBC  --  12.7* 10.0  PLT  --  184 161     Recent Labs Lab 10-27-16 0118 Oct 27, 2016 0630 10/27/16 1750 09/30/16 0510  NA 142  144  --  140 140  K 3.3*  3.2*  --  3.4* 3.0*  CL 108  106  --  109 108  CO2 19*  --  22 23  GLUCOSE 121*  126*  --  120* 125*  BUN 11  12  --  6 6  CREATININE 1.44*  1.30*  --  0.89 0.95  CALCIUM 8.8*  --  7.9* 8.0*  MG  --  1.9  --  1.8  PHOS  --  1.6*  --  2.7   Dg Chest Port 1 View  Result Date: 09/30/2016 CLINICAL DATA:  Acute respiratory failure EXAM: PORTABLE CHEST 1 VIEW COMPARISON:  27-Oct-2016 FINDINGS: There is an endotracheal tube with the tip 3.2 cm above the carina. There is a nasogastric tube with the tip projecting over the stomach. There is no focal parenchymal opacity. There is no pleural effusion or pneumothorax. There is stable cardiomegaly. The osseous structures are unremarkable. IMPRESSION: Support lines and tubing in satisfactory position. Electronically Signed   By: Elige Ko   On: 09/30/2016 09:35   Dg Abd Portable 1v  Result Date: 10/27/2016 CLINICAL DATA:  Orogastric tube placement. EXAM: PORTABLE ABDOMEN - 1 VIEW COMPARISON:  Portable exam 1359 hours FINDINGS: Orogastric tube coiled in proximal stomach. Bowel gas pattern normal. Osseous structures unremarkable. Cardiac silhouette  appears enlarged. Probable atelectasis in LEFT lower lobe. IMPRESSION: Orogastric tube coiled in proximal stomach. Electronically Signed   By: Ulyses Southward M.D.   On: Oct 27, 2016 14:25     A: acute resp failure due to CVA - repeat MRI pending Mild hypomag and hypokalemia  P: full vent support Extubate when more awake but after completing MRI > 5pm and ensuring is ok Replete mag BP control per neuro   .  Rest per NP/medical resident whose note is outlined above and that I agree with  The patient is critically ill with multiple organ systems failure and requires high complexity decision making for assessment and support, frequent evaluation and titration of therapies, application of advanced monitoring technologies and extensive interpretation of multiple databases.   Critical Care Time devoted to patient care services described in this note is  30  Minutes. This time reflects time of care of this signee Dr Kalman Shan. This critical care time does not reflect procedure time, or teaching time or supervisory time of PA/NP/Med student/Med Resident etc but could involve care discussion time    Dr. Kalman Shan, M.D., [facility].C.P Pulmonary and Critical Care Medicine Staff Physician [facility] Pager: [phone], If no answer or between  15:00h - 7:00h:  call [phone]  09/30/2016 11:44 AM

## 2016-09-30 NOTE — Progress Notes (Signed)
Referring Physician(s):  Dr. Pearlean Brownie  Supervising Physician: Richarda Overlie  Patient Status:  [facility] - In-pt  Chief Complaint:  CVA  Subjective: Intubated, sedated.  Arouses to voice.   Allergies: Patient has no known allergies.  Medications: Prior to Admission medications   Medication Sig Start Date End Date Taking? Authorizing Provider  apixaban (ELIQUIS) 2.5 MG TABS tablet Take 2.5 mg by mouth 2 (two) times daily.   Yes Historical Provider, MD  benazepril (LOTENSIN) 20 MG tablet Take 20 mg by mouth daily.   Yes Historical Provider, MD  furosemide (LASIX) 20 MG tablet Take 20 mg by mouth 2 (two) times daily.  04/02/14  Yes Historical Provider, MD  pantoprazole (PROTONIX) 40 MG tablet Take 1 tablet (40 mg total) by mouth daily at 12 noon. 04/25/14  Yes Vassie Loll, MD  polyethylene glycol [facility] / GLYCOLAX) packet Take 17 g by mouth daily. Hold for diarrhea. Patient taking differently: Take 17 g by mouth daily as needed for moderate constipation. Hold for diarrhea. 04/25/14  Yes Vassie Loll, MD  terazosin (HYTRIN) 10 MG capsule Take 10 mg by mouth at bedtime.   Yes Historical Provider, MD  timolol (TIMOPTIC) 0.5 % ophthalmic solution Place 1 drop into both eyes 2 (two) times daily.  12/30/15  Yes Historical Provider, MD  verapamil (CALAN-SR) 240 MG CR tablet Take 240 mg by mouth 2 (two) times daily.    Yes Historical Provider, MD  cholecalciferol (VITAMIN D) 1000 UNITS tablet Take 1,000 Units by mouth daily.    Historical Provider, MD     Vital Signs: BP 133/85   Pulse 84   Temp 98.7 F (37.1 C) (Axillary)   Resp 17   Ht  (1.676 m)   Wt 200 lb 13.4 oz (91.1 kg)   SpO2 100%   BMI 32.42 kg/m   Physical Exam  Intubated/sedation.  Opens eyes to voice Groin site intact.  No evidence of hematoma or pseudoaneurysm.  Dressing clean and dry.   Imaging: Ct Angio Head W Or Wo Contrast  Result Date: 2016-10-25 CLINICAL DATA:  Right-sided facial droop EXAM: CT ANGIOGRAPHY  HEAD AND NECK TECHNIQUE: Multidetector CT imaging of the head and neck was performed using the standard protocol during bolus administration of intravenous contrast. Multiplanar CT image reconstructions and MIPs were obtained to evaluate the vascular anatomy. Carotid stenosis measurements (when applicable) are obtained utilizing NASCET criteria, using the distal internal carotid diameter as the denominator. CONTRAST:  Omnipaque 350 COMPARISON:  None. FINDINGS: CTA NECK FINDINGS Aortic arch: There is no aneurysm or dissection of the visualized ascending aorta or aortic arch. There is a normal variant aortic arch branching pattern with the brachiocephalic and left common carotid arteries sharing a common origin. The visualized proximal subclavian arteries are normal. There is atherosclerotic calcification within the aortic arch. Right carotid system: The right common carotid origin is widely patent. There is no common carotid or internal carotid artery dissection or aneurysm. No hemodynamically significant stenosis. Left carotid system: The left common carotid origin is widely patent. There is no common carotid or internal carotid artery dissection or aneurysm. No hemodynamically significant stenosis. Minimal Vertebral arteries: The vertebral system is codominant. Both vertebral artery origins are normal. Both vertebral arteries are normal to their confluence with the basilar artery. Skeleton: There is no bony spinal canal stenosis. No lytic or blastic lesions. Other neck: The nasopharynx is clear. The oropharynx and hypopharynx are normal. The epiglottis is normal. The supraglottic larynx, glottis and subglottic  larynx are normal. No retropharyngeal collection. The parapharyngeal spaces are preserved. The parotid and submandibular glands are normal. No sialolithiasis or salivary ductal dilatation. The thyroid gland is normal. There is no cervical lymphadenopathy. Upper chest: No pneumothorax or pleural effusion. No  nodules or masses. Review of the MIP images confirms the above findings CTA HEAD FINDINGS Anterior circulation: --Intracranial internal carotid arteries: There its atherosclerotic calcification within both lacerum, cavernous and clinoid segments without hemodynamically significant stenosis. There is fusiform dilatation of the distal right ICA cavernous segment, at the anterior genu, measuring 8 mm. --Anterior cerebral arteries: Normal. --Middle cerebral arteries: There is occlusion of the left middle cerebral artery M2 segment superior division (series 11 images 141-143), approximately 7 mm distal to the MCA bifurcation. There is attenuated enhancement within the distal left MCA distribution. --Posterior communicating arteries: Absent bilaterally. Posterior circulation: --Posterior cerebral arteries: Normal. --Superior cerebellar arteries: Normal. --Basilar artery: Normal. --Anterior inferior cerebellar arteries: Normal. --Posterior inferior cerebellar arteries: Normal. Venous sinuses: As permitted by contrast timing, patent. Anatomic variants: None Delayed phase: No parenchymal contrast enhancement. Review of the MIP images confirms the above findings IMPRESSION: 1. 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 Attenuated distal left MCA distribution vascularity. 2. Fusiform dilatation of the anterior genu of the right internal carotid artery cavernous segment, measuring 8 mm. 3. Normal cervical CTA. 4. Aortic atherosclerosis. Critical Value/emergent results were called by telephone at the time of interpretation on 09/21/2016 at 2:14 am to Dr. Caryl Pina, who verbally acknowledged these results. The case was also discussed with Dr. Gilmer Mor at 2:29 a.m. Electronically Signed   By: Deatra Robinson M.D.   On: 10/02/2016 02:30   Ct Angio Neck W And/or Wo Contrast  Result Date: 09/28/2016 CLINICAL DATA:  Right-sided facial droop EXAM: CT ANGIOGRAPHY  HEAD AND NECK TECHNIQUE: Multidetector CT imaging of the head and neck was performed using the standard protocol during bolus administration of intravenous contrast. Multiplanar CT image reconstructions and MIPs were obtained to evaluate the vascular anatomy. Carotid stenosis measurements (when applicable) are obtained utilizing NASCET criteria, using the distal internal carotid diameter as the denominator. CONTRAST:  Omnipaque 350 COMPARISON:  None. FINDINGS: CTA NECK FINDINGS Aortic arch: There is no aneurysm or dissection of the visualized ascending aorta or aortic arch. There is a normal variant aortic arch branching pattern with the brachiocephalic and left common carotid arteries sharing a common origin. The visualized proximal subclavian arteries are normal. There is atherosclerotic calcification within the aortic arch. Right carotid system: The right common carotid origin is widely patent. There is no common carotid or internal carotid artery dissection or aneurysm. No hemodynamically significant stenosis. Left carotid system: The left common carotid origin is widely patent. There is no common carotid or internal carotid artery dissection or aneurysm. No hemodynamically significant stenosis. Minimal Vertebral arteries: The vertebral system is codominant. Both vertebral artery origins are normal. Both vertebral arteries are normal to their confluence with the basilar artery. Skeleton: There is no bony spinal canal stenosis. No lytic or blastic lesions. Other neck: The nasopharynx is clear. The oropharynx and hypopharynx are normal. The epiglottis is normal. The supraglottic larynx, glottis and subglottic larynx are normal. No retropharyngeal collection. The parapharyngeal spaces are preserved. The parotid and submandibular glands are normal. No sialolithiasis or salivary ductal dilatation. The thyroid gland is normal. There is no cervical lymphadenopathy. Upper chest: No pneumothorax or pleural effusion. No  nodules or masses. Review  of the MIP images confirms the above findings CTA HEAD FINDINGS Anterior circulation: --Intracranial internal carotid arteries: There its atherosclerotic calcification within both lacerum, cavernous and clinoid segments without hemodynamically significant stenosis. There is fusiform dilatation of the distal right ICA cavernous segment, at the anterior genu, measuring 8 mm. --Anterior cerebral arteries: Normal. --Middle cerebral arteries: There is occlusion of the left middle cerebral artery M2 segment superior division (series 11 images 141-143), approximately 7 mm distal to the MCA bifurcation. There is attenuated enhancement within the distal left MCA distribution. --Posterior communicating arteries: Absent bilaterally. Posterior circulation: --Posterior cerebral arteries: Normal. --Superior cerebellar arteries: Normal. --Basilar artery: Normal. --Anterior inferior cerebellar arteries: Normal. --Posterior inferior cerebellar arteries: Normal. Venous sinuses: As permitted by contrast timing, patent. Anatomic variants: None Delayed phase: No parenchymal contrast enhancement. Review of the MIP images confirms the above findings IMPRESSION: 1. 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 Attenuated distal left MCA distribution vascularity. 2. Fusiform dilatation of the anterior genu of the right internal carotid artery cavernous segment, measuring 8 mm. 3. Normal cervical CTA. 4. Aortic atherosclerosis. Critical Value/emergent results were called by telephone at the time of interpretation on 09/23/2016 at 2:14 am to Dr. Caryl Pina, who verbally acknowledged these results. The case was also discussed with Dr. Gilmer Mor at 2:29 a.m. Electronically Signed   By: Deatra Robinson M.D.   On: 09/26/2016 02:30   Ir US Guide Vasc Access Left  Result Date: 09/28/2016 INDICATION: 83 year old male with a history of acute left MCA  occlusion EXAM: ULTRASOUND GUIDED ACCESS RIGHT COMMON FEMORAL ARTERY. LEFT CERVICAL AND CEREBRAL ANGIOGRAM FOR ATTEMPT AT LEFT MCA THROMBECTOMY COMPARISON:  CT and CT angiogram 09/08/2016 MEDICATIONS: 2.0 g Ancef. The antibiotic was administered within 1 hour of the procedure ANESTHESIA/SEDATION: General anesthesia CONTRAST:  75 cc Isovue 300 FLUOROSCOPY TIME:  Fluoroscopy Time: 48 minutes 24 seconds (1053 mGy). COMPLICATIONS: None TECHNIQUE: Informed written consent was obtained from the patient after a thorough discussion of the procedural risks, benefits and alternatives. All questions were addressed. Maximal Sterile Barrier Technique was utilized including caps, mask, sterile gowns, sterile gloves, sterile drape, hand hygiene and skin antiseptic. A timeout was performed prior to the initiation of the procedure. PROCEDURE: Patient positioned supine position on the fluoroscopy table. The right inguinal region was prepped and draped in the usual sterile fashion. 1% lidocaine was used for local anesthesia at the puncture site. Ultrasound survey of the right inguinal region was performed with images stored and sent to PACs. A micropuncture needle was used access the right common femoral artery under ultrasound. With excellent arterial blood flow returned, and an .018 micro wire was passed through the needle, observed enter the abdominal aorta under fluoroscopy. The needle was removed, and a micropuncture sheath was placed over the wire. The inner dilator and wire were removed, and an 035 Bentson wire was advanced under fluoroscopy into the abdominal aorta. The sheath was removed and a standard 5 [place] vascular sheath was placed. The dilator was removed and the sheath was flushed. JB 1 catheter was advanced over the wire, with the wire and catheter combination placed into the proximal descending thoracic aorta. Wire was removed and double flush was performed. JB 1 catheter was advanced into the common origin of the  innominate artery and left common carotid artery. With the catheter at the origin of the left common carotid artery, wire was advanced. Catheter would not advance into the common  carotid artery. Catheter was then exchanged for a Sim 2 catheter, with catheter re- formed at the aortic arch. Sim 2 catheter was used to select the origin of left common carotid artery. The catheter would not advance beyond the series of turns at the proximal left common carotid artery for purchase. Advancing both Glidewire and roadrunner wire would not allow placement of the catheter. Sim 2 catheter was then exchanged for a JB 2 catheter. Catheter would not advance into the common carotid artery over wire. Davis catheter was then selected. Days catheter would not advance it pass the series a turned to the proximal common carotid artery over a Glidewire or roadrunner wire. The 55 cm bright tip 8 French sheath was advanced into the aorta, with series if catheters again used to an attempt to select the common carotid artery with extra support from the sheath. No catheter was successful in advancing beyond the series of initial turns within the common carotid artery. The 55 cm bright tip sheath was then exchanged for an 8 French 70 cm angled flexor sheath. Again, no catheter was successful and advancing within the left common carotid artery. Six French envoy Simmons 2 catheter was then placed through the flexor sheath, unsuccessful a navigating beyond the initial turns. This catheter was removed and the 8 French 55 cm bright tip sheath was then again placed with the Envoy catheter used in an attempt to select the common carotid artery. The Envoy a catheter would not advance beyond the initial turns of the common carotid artery. Coaxial microcatheter system using a coaxial pro 18 catheter and synchro soft were advanced into the common carotid artery and the internal carotid artery. The Envoy catheter would not advance over the microcatheter  system. The synchro soft wire was removed and exchanged for a soft tipped Transcend wire. The diagnostic catheters would not advance over the micro system beyond the initial turns of the common carotid artery. All catheters and wires were then removed. Angiogram was performed at the right femoral artery access. No complications were encountered. FINDINGS: Ultrasound of the right common femoral artery demonstrates patent vessel with minimal atherosclerosis. Angiogram of the left common carotid artery origin: Internal carotid artery: Extensive tortuosity of the left common carotid artery, with the common origin from the innominate artery. The series of turns at the proximal left common carotid artery precluded placement of any diagnostic catheter, and the ability to access the intracranial vessels. MCA: Proximal middle cerebral artery with mild tortuosity and no significant atherosclerotic changes. Early temporal branch. Distal M1/ M2 branch of the left MCA. Minimal MCA operculum air and cortical branches filling, although the injection is incomplete given the site of the catheter tip. Anterior cerebral artery: Patent A1, with no cross-filling to the right. Leptomeningeal collateral vessels present within the watershed region. IMPRESSION: Status post left cerebral angiogram demonstrating common origin of the left common carotid artery from the innominate artery as well as significantly tortuous proximal left ICA, precluding placement of catheter system into the MCA with inability to treat the M2 occlusion. Signed, Yvone Neu. Loreta Ave, DO [facility] Radiology PLAN: The right common femoral artery sheath access will be managed by vascular Interventional. Patient is stable to neuro ICU. Electronically Signed   By: Gilmer Mor D.O.   On: 09/11/2016 05:25   Dg Chest Port 1 View  Result Date: 09/30/2016 CLINICAL DATA:  Acute respiratory failure EXAM: PORTABLE CHEST 1 VIEW  COMPARISON:  09/09/2016 FINDINGS: There is an endotracheal  tube with the tip 3.2 cm above the carina. There is a nasogastric tube with the tip projecting over the stomach. There is no focal parenchymal opacity. There is no pleural effusion or pneumothorax. There is stable cardiomegaly. The osseous structures are unremarkable. IMPRESSION: Support lines and tubing in satisfactory position. Electronically Signed   By: Elige Ko   On: 09/30/2016 09:35   Portable Chest Xray  Result Date: 09/05/2016 CLINICAL DATA:  Stroke EXAM: PORTABLE CHEST 1 VIEW COMPARISON:  04/22/2014 FINDINGS: Endotracheal tube tip is 2.6 cm from the carina. The heart is moderately enlarged. Normal vascularity. Low lung volumes. There is prominence of the superior mediastinum and right paratracheal regions which may reflect a central vessel crowding and low volumes. Atelectasis at the left mid and lower lung zones is noted. IMPRESSION: Low volumes with left lung central basilar atelectasis. Prominent mediastinum likely due to low volumes. Endotracheal tube. Cardiomegaly without decompensation. Electronically Signed   By: Jolaine Click M.D.   On: 10/02/2016 07:51   Dg Abd Portable 1v  Result Date: 09/10/2016 CLINICAL DATA:  Orogastric tube placement. EXAM: PORTABLE ABDOMEN - 1 VIEW COMPARISON:  Portable exam 1359 hours FINDINGS: Orogastric tube coiled in proximal stomach. Bowel gas pattern normal. Osseous structures unremarkable. Cardiac silhouette appears enlarged. Probable atelectasis in LEFT lower lobe. IMPRESSION: Orogastric tube coiled in proximal stomach. Electronically Signed   By: Ulyses Southward M.D.   On: 09/22/2016 14:25   Ir Percutaneous Art Thrombectomy/infusion Intracranial Inc Diag Angio  Result Date: 09/09/2016 INDICATION: 83 year old male with a history of acute left MCA occlusion EXAM: ULTRASOUND GUIDED ACCESS RIGHT COMMON FEMORAL ARTERY. LEFT CERVICAL AND CEREBRAL ANGIOGRAM FOR ATTEMPT AT LEFT MCA THROMBECTOMY COMPARISON:   CT and CT angiogram 09/09/2016 MEDICATIONS: 2.0 g Ancef. The antibiotic was administered within 1 hour of the procedure ANESTHESIA/SEDATION: General anesthesia CONTRAST:  75 cc Isovue 300 FLUOROSCOPY TIME:  Fluoroscopy Time: 48 minutes 24 seconds (1053 mGy). COMPLICATIONS: None TECHNIQUE: Informed written consent was obtained from the patient after a thorough discussion of the procedural risks, benefits and alternatives. All questions were addressed. Maximal Sterile Barrier Technique was utilized including caps, mask, sterile gowns, sterile gloves, sterile drape, hand hygiene and skin antiseptic. A timeout was performed prior to the initiation of the procedure. PROCEDURE: Patient positioned supine position on the fluoroscopy table. The right inguinal region was prepped and draped in the usual sterile fashion. 1% lidocaine was used for local anesthesia at the puncture site. Ultrasound survey of the right inguinal region was performed with images stored and sent to PACs. A micropuncture needle was used access the right common femoral artery under ultrasound. With excellent arterial blood flow returned, and an .018 micro wire was passed through the needle, observed enter the abdominal aorta under fluoroscopy. The needle was removed, and a micropuncture sheath was placed over the wire. The inner dilator and wire were removed, and an 035 Bentson wire was advanced under fluoroscopy into the abdominal aorta. The sheath was removed and a standard 5 [place] vascular sheath was placed. The dilator was removed and the sheath was flushed. JB 1 catheter was advanced over the wire, with the wire and catheter combination placed into the proximal descending thoracic aorta. Wire was removed and double flush was performed. JB 1 catheter was advanced into the common origin of the innominate artery and left common carotid artery. With the catheter at the origin of the left common carotid artery, wire was advanced. Catheter would not  advance into the common carotid artery.  Catheter was then exchanged for a Sim 2 catheter, with catheter re- formed at the aortic arch. Sim 2 catheter was used to select the origin of left common carotid artery. The catheter would not advance beyond the series of turns at the proximal left common carotid artery for purchase. Advancing both Glidewire and roadrunner wire would not allow placement of the catheter. Sim 2 catheter was then exchanged for a JB 2 catheter. Catheter would not advance into the common carotid artery over wire. Davis catheter was then selected. Days catheter would not advance it pass the series a turned to the proximal common carotid artery over a Glidewire or roadrunner wire. The 55 cm bright tip 8 French sheath was advanced into the aorta, with series if catheters again used to an attempt to select the common carotid artery with extra support from the sheath. No catheter was successful in advancing beyond the series of initial turns within the common carotid artery. The 55 cm bright tip sheath was then exchanged for an 8 French 70 cm angled flexor sheath. Again, no catheter was successful and advancing within the left common carotid artery. Six French envoy Simmons 2 catheter was then placed through the flexor sheath, unsuccessful a navigating beyond the initial turns. This catheter was removed and the 8 French 55 cm bright tip sheath was then again placed with the Envoy catheter used in an attempt to select the common carotid artery. The Envoy a catheter would not advance beyond the initial turns of the common carotid artery. Coaxial microcatheter system using a coaxial pro 18 catheter and synchro soft were advanced into the common carotid artery and the internal carotid artery. The Envoy catheter would not advance over the microcatheter system. The synchro soft wire was removed and exchanged for a soft tipped Transcend wire. The diagnostic catheters would not advance over the micro system  beyond the initial turns of the common carotid artery. All catheters and wires were then removed. Angiogram was performed at the right femoral artery access. No complications were encountered. FINDINGS: Ultrasound of the right common femoral artery demonstrates patent vessel with minimal atherosclerosis. Angiogram of the left common carotid artery origin: Internal carotid artery: Extensive tortuosity of the left common carotid artery, with the common origin from the innominate artery. The series of turns at the proximal left common carotid artery precluded placement of any diagnostic catheter, and the ability to access the intracranial vessels. MCA: Proximal middle cerebral artery with mild tortuosity and no significant atherosclerotic changes. Early temporal branch. Distal M1/ M2 branch of the left MCA. Minimal MCA operculum air and cortical branches filling, although the injection is incomplete given the site of the catheter tip. Anterior cerebral artery: Patent A1, with no cross-filling to the right. Leptomeningeal collateral vessels present within the watershed region. IMPRESSION: Status post left cerebral angiogram demonstrating common origin of the left common carotid artery from the innominate artery as well as significantly tortuous proximal left ICA, precluding placement of catheter system into the MCA with inability to treat the M2 occlusion. Signed, Yvone Neu. Loreta Ave, DO [facility] Radiology PLAN: The right common femoral artery sheath access will be managed by vascular Interventional. Patient is stable to neuro ICU. Electronically Signed   By: Gilmer Mor D.O.   On: 09/24/2016 05:25   Ct Head Code Stroke W/o Cm  Addendum Date: 09/07/2016   ADDENDUM REPORT: 09/06/2016 02:31 ADDENDUM: Findings were discussed with Dr. Caryl Pina at approximately 2 a.m., via telephone. Electronically  Signed   By: Deatra Robinson M.D.   On: 10/01/16 02:31   Result  Date: October 01, 2016 CLINICAL DATA:  Code stroke.  Right-sided facial droop EXAM: CT HEAD WITHOUT CONTRAST TECHNIQUE: Contiguous axial images were obtained from the base of the skull through the vertex without intravenous contrast. COMPARISON:  None. 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 No CT evidence of acute cortical infarct. No hemorrhage or extra-axial collection. There is periventricular hypoattenuation compatible with chronic microvascular disease. There is chronic volume loss. Vascular: No hyperdense vessel sign. Atherosclerotic calcification of the vertebral and internal carotid arteries at the skull base. Skull: Normal visualized skull base, calvarium and extracranial soft tissues. Sinuses/Orbits: No sinus fluid levels or advanced mucosal thickening. No mastoid effusion. Normal orbits. ASPECTS [facility] Stroke Program Early CT Score) - Ganglionic level infarction (caudate, lentiform nuclei, internal capsule, insula, M1-M3 cortex): 7 - Supraganglionic infarction (M4-M6 cortex): 3 Total score (0-10 with 10 being normal): 10 IMPRESSION: 1. No acute intracranial hemorrhage. 2. Old right occipital infarct, chronic microvascular ischemia and cerebral atrophy. 3. ASPECTS is 10. Dr.  Caryl Pina was paged at 1:23 a.m. Electronically Signed: By: Deatra Robinson M.D. On: 2016/10/01 01:24    Labs:  CBC:  Recent Labs  2016/10/01 0118 10/01/16 0236 09/30/16 0510  WBC  --  12.7* 10.0  HGB 13.9 13.2 12.1*  HCT 41.0 40.4 36.5*  PLT  --  184 161    COAGS:  Recent Labs  10-01-16 0118  INR 1.18  APTT 32    BMP:  Recent Labs  10-01-2016 0118 October 01, 2016 1750 09/30/16 0510  NA 142  144 140 140  K 3.3*  3.2* 3.4* 3.0*  CL 108  106 109 108  CO2 19* 22 23  GLUCOSE 121*  126* 120* 125*  BUN CALCIUM 8.8* 7.9* 8.0*  CREATININE 1.44*  1.30* 0.89 0.95  GFRNONAA 43* >60 >60  GFRAA 50* >60 >60    LIVER FUNCTION TESTS:  Recent Labs  2016/10/01 0118  BILITOT 0.6    AST 30  ALT 10*  ALKPHOS 61  PROT 6.9  ALBUMIN 3.9    Assessment and Plan: CVA s/p left cerebral angiogram without intervention Sheath removed.  Groin intact.  No evidence of hematoma or pseudoaneurysm.  IR available if needed.   Electronically Signed: Hoyt Koch 09/30/2016, 3:56 PM   I spent a total of 15 Minutes at the the patient's bedside AND on the patient's hospital floor or unit, greater than 50% of which was counseling/coordinating care for CVA

## 2016-10-01 ENCOUNTER — Inpatient Hospital Stay (HOSPITAL_COMMUNITY): Payer: Medicare Other

## 2016-10-01 DIAGNOSIS — Z9189 Other specified personal risk factors, not elsewhere classified: Secondary | ICD-10-CM

## 2016-10-01 DIAGNOSIS — Z7189 Other specified counseling: Secondary | ICD-10-CM

## 2016-10-01 DIAGNOSIS — Z789 Other specified health status: Secondary | ICD-10-CM

## 2016-10-01 DIAGNOSIS — I6789 Other cerebrovascular disease: Secondary | ICD-10-CM

## 2016-10-01 DIAGNOSIS — I63312 Cerebral infarction due to thrombosis of left middle cerebral artery: Secondary | ICD-10-CM

## 2016-10-01 DIAGNOSIS — I5022 Chronic systolic (congestive) heart failure: Secondary | ICD-10-CM

## 2016-10-01 LAB — BASIC METABOLIC PANEL
ANION GAP: 8 (ref 5–15)
BUN: 7 mg/dL (ref 6–20)
CALCIUM: 8 mg/dL — AB (ref 8.9–10.3)
CHLORIDE: 112 mmol/L — AB (ref 101–111)
CO2: 21 mmol/L — ABNORMAL LOW (ref 22–32)
Creatinine, Ser: 0.95 mg/dL (ref 0.61–1.24)
GFR calc Af Amer: >60 mL/min (ref >60)
GFR calc non Af Amer: >60 mL/min (ref >60)
Glucose, Bld: 98 mg/dL (ref 65–99)
Potassium: 3.9 mmol/L (ref 3.5–5.1)
Sodium: 141 mmol/L (ref 135–145)

## 2016-10-01 LAB — ECHOCARDIOGRAM COMPLETE
Height: 66 in
Weight: 3213.42 oz

## 2016-10-01 LAB — TROPONIN I: Troponin I: 0.13 ng/mL (ref <0.03)

## 2016-10-01 MED ORDER — PANTOPRAZOLE SODIUM 40 MG PO PACK
40.0000 mg | PACK | Freq: Every day | ORAL | Status: DC
  Administered 2016-10-01 – 2016-10-03 (×3): 40 mg
  Filled 2016-10-01 (×3): qty 20

## 2016-10-01 MED ORDER — POTASSIUM CHLORIDE 20 MEQ/15ML (10%) PO SOLN
20.0000 meq | Freq: Two times a day (BID) | ORAL | Status: DC
  Administered 2016-10-01 (×2): 20 meq via ORAL
  Filled 2016-10-01 (×2): qty 15

## 2016-10-01 MED ORDER — PROPOFOL 1000 MG/100ML IV EMUL
5.0000 ug/kg/min | INTRAVENOUS | Status: DC
  Administered 2016-10-01: 30 ug/kg/min via INTRAVENOUS
  Administered 2016-10-01: 5 ug/kg/min via INTRAVENOUS
  Administered 2016-10-02: 30.004 ug/kg/min via INTRAVENOUS
  Filled 2016-10-01 (×2): qty 100

## 2016-10-01 MED ORDER — FUROSEMIDE 10 MG/ML IJ SOLN
40.0000 mg | Freq: Three times a day (TID) | INTRAMUSCULAR | Status: DC
Start: 2016-10-01 — End: 2016-10-02
  Administered 2016-10-01 – 2016-10-02 (×3): 40 mg via INTRAVENOUS
  Filled 2016-10-01 (×3): qty 4

## 2016-10-01 MED ORDER — FUROSEMIDE 10 MG/ML IJ SOLN
20.0000 mg | Freq: Once | INTRAMUSCULAR | Status: AC
  Administered 2016-10-01: 20 mg via INTRAVENOUS
  Filled 2016-10-01: qty 2

## 2016-10-01 MED ORDER — FENTANYL CITRATE (PF) 100 MCG/2ML IJ SOLN
25.0000 ug | INTRAMUSCULAR | Status: DC | PRN
  Administered 2016-10-01 (×2): 50 ug via INTRAVENOUS
  Administered 2016-10-01 – 2016-10-03 (×4): 100 ug via INTRAVENOUS
  Filled 2016-10-01 (×7): qty 2

## 2016-10-01 MED ORDER — POTASSIUM CHLORIDE CRYS ER 20 MEQ PO TBCR
20.0000 meq | EXTENDED_RELEASE_TABLET | Freq: Two times a day (BID) | ORAL | Status: DC
  Filled 2016-10-01: qty 1
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
## 2016-10-01 NOTE — Assessment & Plan Note (Signed)
Echo from few years ago with EF 45%  Plan  start lasix Recheck echo

## 2016-10-01 NOTE — Assessment & Plan Note (Signed)
ppi

## 2016-10-01 NOTE — Assessment & Plan Note (Signed)
L MCA stroke at admit  09/20/2016 wth revasc on MRI and stability 4/.28/18. Per Neuro 10/01/2016 wll be aphasic  Plan Per stroke service

## 2016-10-01 NOTE — Assessment & Plan Note (Signed)
D/w Daughter Alissa who along with her sister Elenaor also d/w Dr Pearlean Brownie  Plan  - DNR  - DNI  - Full medical care

## 2016-10-01 NOTE — Assessment & Plan Note (Signed)
Doing sbt .has cough and gag but weak, deconditoned, lethargic and with resp secretions(ef 45% from few years ago)  Plan for 10/01/2016  - diurese  - so hold off extubation  10/01/2016 -> one way extubation only per family  - PSV as tolerated - Fulll vent otherwise - no reintubation

## 2016-10-01 NOTE — Assessment & Plan Note (Signed)
HR 86 on cleviprex Anticoag on hold  Plan cleviprex for sbp < 180 Anticoag timing per neuro

## 2016-10-01 NOTE — Progress Notes (Signed)
STROKE TEAM PROGRESS NOTE   HISTORY OF PRESENT ILLNESS (per record) Ricky Guzman is an 83 y.o. male who presented with right hemiplegia and aphasia. He was LKN at 2230 and found by family with above symptoms at 0030. BP on EMS arrival was 196/102, with CBG 126. Code Stroke was initiated en route to the ED.  His PMHx includes atrial fibrillation and HTN. On Eliquis 2.5 Mg BID PTA - under dosed.  LSN: [id number] tPA Given: No: Out of 3 hour IV tPA time window for patients > 47 years of age.    SUBJECTIVE (INTERVAL HISTORY) His daughter is    at the bedside.  Pt still intubated and not following commands on propofol, but moving all extremities on pain stimulation, Plan for extubation today OBJECTIVE Temp:  [98.1 F (36.7 C)-99.2 F (37.3 C)] 99.1 F (37.3 C) (04/29 0807) Pulse Rate:  [36-115] 72 (04/29 0715) Cardiac Rhythm: Atrial fibrillation (04/28 2000) Resp:  [11-26] 11 (04/29 0715) BP: (86-174)/(62-115) 120/74 (04/29 0715) SpO2:  [80 %-100 %] 100 % (04/29 0719) Arterial Line BP: (101-212)/(56-112) 138/72 (04/29 0715) FiO2 (%):  [40 %] 40 % (04/29 0719)  CBC:   Recent Labs Lab 10-11-2016 0236 09/30/16 0510  WBC 12.7* 10.0  NEUTROABS 10.4* 7.7  HGB 13.2 12.1*  HCT 40.4 36.5*  MCV 89.4 87.7  PLT 184 161    Basic Metabolic Panel:   Recent Labs Lab 10/11/16 0630  09/30/16 0510 09/30/16 1600 10/01/16 0430  NA  --   < > 140 142 141  K  --   < > 3.0* 4.4 3.9  CL  --   < > 108 112* 112*  CO2  --   < > 23 21* 21*  GLUCOSE  --   < > 125* 113* 98  BUN  --   < > CREATININE  --   < > 0.95 0.82 0.95  CALCIUM  --   < > 8.0* 8.0* 8.0*  MG 1.9  --  1.8  --   --   PHOS 1.6*  --  2.7  --   --   < > = values in this interval not displayed.  Lipid Panel:     Component Value Date/Time   CHOL 137 2016-10-11 0545   TRIG 118 Oct 11, 2016 0545   HDL 33 (L) 10-11-16 0545   CHOLHDL 4.2 10/11/2016 0545   VLDL 24 Oct 11, 2016 0545   LDLCALC 80 10/11/2016 0545   HgbA1c:  Lab  Results  Component Value Date   HGBA1C 5.8 (H) 10/11/16   Urine Drug Screen: No results found for: LABOPIA, COCAINSCRNUR, LABBENZ, AMPHETMU, THCU, LABBARB  Alcohol Level No results found for: [facility]   IMAGING I have personally reviewed the radiological images below and agree with the radiology interpretations.  Ct Angio Head W Or Wo Contrast Ct Angio Neck W And/or Wo Contrast 10-11-2016 1. 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 Attenuated distal left MCA distribution vascularity.  2. Fusiform dilatation of the anterior genu of the Rt ICA cavernous segment, measuring 8 mm.  3. Normal cervical CTA.  4. Aortic atherosclerosis.   Ir Percutaneous Art Thrombectomy/infusion Intracranial Inc Diag Angio Ir US Guide Vasc Access Left  11-Oct-2016 Status post left cerebral angiogram demonstrating common origin of the left common carotid artery from the innominate artery as well as significantly tortuous proximal left ICA, precluding placement of catheter system into the MCA with  inability to treat the M2 occlusion.     Portable Chest Xray 09/10/2016 Low volumes with left lung central basilar atelectasis. Prominent mediastinum likely due to low volumes. Endotracheal tube. Cardiomegaly without decompensation.    Ct Head Code Stroke W/o Cm 09/23/2016   1. No acute intracranial hemorrhage.  2. Old right occipital infarct, chronic microvascular ischemia and cerebral atrophy.  3. ASPECTS is 10.   TTE pending  MRI  Motion degraded exam. Acute infarction affecting much of the superior division left MCA territory. Swelling but no hemorrhage or mass effect. MRA : appears to show restoration of flow in the major left middle cerebral branches. PHYSICAL EXAM  Temp:  [98.1 F (36.7 C)-99.2 F (37.3 C)] 99.1 F (37.3 C) (04/29 0807) Pulse Rate:  [36-115] 72 (04/29 0715) Resp:  [11-26] 11 (04/29 0715) BP: (86-174)/(62-115) 120/74  (04/29 0715) SpO2:  [80 %-100 %] 100 % (04/29 0719) Arterial Line BP: (101-212)/(56-112) 138/72 (04/29 0715) FiO2 (%):  [40 %] 40 % (04/29 0719)  General - Well nourished, well developed, intubated on sedation.  Ophthalmologic - Fundi not visualized.  Cardiovascular - irregularly irregular heart rate and rhythm.  Neuro - intubated on sedation, barely open eyes on voice and pain, not following commands. Left eye gaze, PERRL, doll's eyes positive, not blinking to visual threat with forced eye opening, not tracking. Right corneal absent, but left corneal and gag positive. On pain stimulation, withdraw all extremities equally, at least 2+/5. DTR 1+ and bilateral babinski positive. sensation, coordination and gait not tested.    ASSESSMENT/PLAN [name] is a 83 y.o. male with history of small bowel obstruction, rectal bleeding, hypertension, atrial fibrillation (Eliquis - under dosed), and old stroke by imaging presenting with right hemiplegia and aphasia. He did not receive IV t-PA due to late presentation. Attempted but unsuccessful thrombectomy.  Stroke:  Left MCA infarct with M1/M2 junction occlusion - embolic - secondary to afib with under-dosed eliquis  Resultant  Intubated on sedation MRI  - Motion degraded exam. Acute infarction affecting much of the superior division left MCA territory. Swelling but no hemorrhage or mass effect.   MRA -  appears to show restoration of flow in the major left  middle cerebral branches.  CTA head and neck - left M1/M2 occlusion, right ICA cavernous fusiform dilation    IR attempted but aborted due to tortuosity of the left ICA  2D Echo - pending  LDL - 80  HgbA1c - 5.8  VTE prophylaxis - SCDs Diet NPO time specified  eliquis 2.5mg  bid prior to admission, now on aspirin 325 mg daily.  Patient counseled to be compliant with his antithrombotic medications  Ongoing aggressive stroke risk factor management  Therapy  recommendations: pending  Disposition: Pending  afib on eliquis  Pt eliquis 2.5mg  bid PTA was under dosed as pt weight > 60Kg and Cre < 1.5.   Hold off anticoagulation for now pending MRI to evaluate infarct size  On ASA  PR  Consider resuming Eliquis  bid once appropirate.  Respiratory failure  Intubated for intervention  CCM on board  Evaluating possible extubation after sheath removal.   Hypertension  Stable  Permissive hypertension (OK if < 180/105 due to groin sheath) but gradually normalize in 5-7 days  Long-term BP goal 130-150 due to left MCA M1/M2 occlusion  Hyperlipidemia  Home meds: No lipid lowering medications prior to admission  LDL 80, goal < 70  Start Pravachol 20 mg daily  Continue statin at discharge  Other Stroke Risk Factors  Advanced age  ETOH use, advised to drink no more than 1drink per day  Obesity, Body mass index is 32.42 kg/m., recommend weight loss, diet and exercise as appropriate   Hx stroke/TIA - by imaging  Atrial fib  Other Active Problems  Fusiform dilatation of the anterior genu of the Rt ICA cavernous segment, measuring 8 mm.  Mild leukocytosis - 12.7 -> 10.0 oral temp 98.6  Hypokalemia - 3.2 -> 3.4 -> 3.0 -> supplement -> recheck in AM  Hypophosphatemia - 1.6  Elevated creatinine - 1.30 ->1.44 -> 0.89 -> 0.95  NPO -> NG tube  Intubated  Hospital day # 2 Plan  D/W Dr Marchelle Gearing   extubate if tolerated.   w/u D/w daughter  at bedside. This patient is critically ill due to left MCA infarct, left MCA occlusion, afib and intubated and at significant risk of neurological worsening, death form recurrent stroke, hemorrhagic transformation, heart failure, cerebral edema and seizure. This patient's care requires constant monitoring of vital signs, hemodynamics, respiratory and cardiac monitoring, review of multiple databases, neurological assessment, discussion with family, other specialists and medical decision  making of high complexity. I spent 30 minutes of neurocritical care time in the care of this patient.   Delia Heady, MD Medical Director [facility] Pager: [phone] 10/01/2016 1:59 PM  Phone [phone] Fax [phone] To contact Stroke Continuity provider, please refer to [email or web address]. After hours, contact General Neurology

## 2016-10-01 NOTE — Progress Notes (Signed)
SLP Cancellation Note  Patient Details Name: Ricky Guzman MRN: [id number] DOB: [birth year]   Cancelled treatment:       Reason Eval/Treat Not Completed: Medical issues which prohibited therapy (intubated).   Maxcine Ham 10/01/2016, 9:59 AM  Maxcine Ham, M.A. CCC-SLP [phone]

## 2016-10-01 NOTE — Assessment & Plan Note (Signed)
scd for dvt proph anticoag restart timing pe neuro

## 2016-10-01 NOTE — Progress Notes (Signed)
  Echocardiogram 2D Echocardiogram has been performed.  Delcie Roch 10/01/2016, 4:32 PM

## 2016-10-02 ENCOUNTER — Inpatient Hospital Stay (HOSPITAL_COMMUNITY): Payer: Medicare Other

## 2016-10-02 ENCOUNTER — Encounter (HOSPITAL_COMMUNITY): Payer: Self-pay | Admitting: Interventional Radiology

## 2016-10-02 DIAGNOSIS — I340 Nonrheumatic mitral (valve) insufficiency: Secondary | ICD-10-CM

## 2016-10-02 DIAGNOSIS — I10 Essential (primary) hypertension: Secondary | ICD-10-CM

## 2016-10-02 DIAGNOSIS — I482 Chronic atrial fibrillation: Secondary | ICD-10-CM

## 2016-10-02 DIAGNOSIS — I5022 Chronic systolic (congestive) heart failure: Secondary | ICD-10-CM

## 2016-10-02 DIAGNOSIS — I63512 Cerebral infarction due to unspecified occlusion or stenosis of left middle cerebral artery: Secondary | ICD-10-CM

## 2016-10-02 LAB — CBC WITH DIFFERENTIAL/PLATELET
Basophils Absolute: 0 10*3/uL (ref 0.0–0.1)
Basophils Relative: 0 %
Eosinophils Absolute: 0.2 10*3/uL (ref 0.0–0.7)
Eosinophils Relative: 2 %
HCT: 37.6 % — ABNORMAL LOW (ref 39.0–52.0)
Hemoglobin: 12.2 g/dL — ABNORMAL LOW (ref 13.0–17.0)
Lymphocytes Relative: 14 %
Lymphs Abs: 1.3 10*3/uL (ref 0.7–4.0)
MCH: 29 pg (ref 26.0–34.0)
MCHC: 32.4 g/dL (ref 30.0–36.0)
MCV: 89.5 fL (ref 78.0–100.0)
Monocytes Absolute: 1.2 10*3/uL — ABNORMAL HIGH (ref 0.1–1.0)
Monocytes Relative: 12 %
Neutro Abs: 6.8 10*3/uL (ref 1.7–7.7)
Neutrophils Relative %: 72 %
Platelets: 164 10*3/uL (ref 150–400)
RBC: 4.2 MIL/uL — ABNORMAL LOW (ref 4.22–5.81)
RDW: 14.4 % (ref 11.5–15.5)
WBC: 9.5 10*3/uL (ref 4.0–10.5)

## 2016-10-02 LAB — BASIC METABOLIC PANEL
ANION GAP: 8 (ref 5–15)
BUN: 11 mg/dL (ref 6–20)
CHLORIDE: 107 mmol/L (ref 101–111)
CO2: 26 mmol/L (ref 22–32)
Calcium: 8.3 mg/dL — ABNORMAL LOW (ref 8.9–10.3)
Creatinine, Ser: 1.27 mg/dL — ABNORMAL HIGH (ref 0.61–1.24)
GFR calc Af Amer: 58 mL/min — ABNORMAL LOW (ref >60)
GFR, EST NON AFRICAN AMERICAN: 50 mL/min — AB (ref >60)
GLUCOSE: 109 mg/dL — AB (ref 65–99)
POTASSIUM: 3.7 mmol/L (ref 3.5–5.1)
Sodium: 141 mmol/L (ref 135–145)

## 2016-10-02 LAB — ECHOCARDIOGRAM COMPLETE
Height: 66 in
Weight: 3213.42 [oz_av]

## 2016-10-02 LAB — PHOSPHORUS: Phosphorus: 2.8 mg/dL (ref 2.5–4.6)

## 2016-10-02 LAB — MAGNESIUM: Magnesium: 2.1 mg/dL (ref 1.7–2.4)

## 2016-10-02 MED ORDER — POTASSIUM CHLORIDE 20 MEQ/15ML (10%) PO SOLN
40.0000 meq | Freq: Once | ORAL | Status: AC
  Administered 2016-10-02: 40 meq
  Filled 2016-10-02: qty 30

## 2016-10-02 MED ORDER — FUROSEMIDE 10 MG/ML IJ SOLN
40.0000 mg | Freq: Three times a day (TID) | INTRAMUSCULAR | Status: AC
  Administered 2016-10-02 (×2): 40 mg via INTRAVENOUS
  Filled 2016-10-02 (×2): qty 4

## 2016-10-02 MED ORDER — CHLORHEXIDINE GLUCONATE 0.12 % MT SOLN
OROMUCOSAL | Status: AC
  Filled 2016-10-02: qty 15

## 2016-10-02 MED ORDER — ORAL CARE MOUTH RINSE
15.0000 mL | OROMUCOSAL | Status: DC
  Administered 2016-10-02 – 2016-10-05 (×26): 15 mL via OROMUCOSAL

## 2016-10-02 NOTE — Progress Notes (Signed)
SLP Cancellation Note  Patient Details Name: Kaylin Schellenberg MRN: [id number] DOB: 09-22-1930   Cancelled treatment:       Reason Eval/Treat Not Completed: Medical issues which prohibited therapy - pt remains intubated, weaning. Will f/u as able.   Maxcine Ham 10/02/2016, 11:22 AM  Maxcine Ham, M.A. CCC-SLP [phone]

## 2016-10-02 NOTE — Progress Notes (Signed)
  Echocardiogram 2D Echocardiogram has been performed.  Ricky Guzman [name] 10/02/2016, 2:44 PM

## 2016-10-02 NOTE — Progress Notes (Signed)
PULMONARY / CRITICAL CARE MEDICINE   Name: Ricky Guzman MRN: [id number] DOB: [birth year]    ADMISSION DATE:  2016-10-20 CONSULTATION DATE:  Oct 20, 2016  REFERRING MD:  Otelia Limes  CHIEF COMPLAINT:  AMS  brief Pt is encephelopathic; therefore, this HPI is obtained from chart review. Ricky Guzman is a 83 y.o. male with PMH as outlined below.  He was found at 0030 4/27 with R-sided weakness and R-sided facial droop in addition to being aphasic and non-verbal. LSN by family at 2230 the night prior. Family denies any head injury or trauma and felt that pt was in his usual state of health the night prior. CTA with occlusion of left MCA M2-segment. Seen by neurology and IR who attempted percutaneous left MCA thrombectomy however was unsuccessful 2/2 patient anatomy. He then returned to ICU on vent and PCCM asked to assist with vent management.  STUDIES: CT head 4/27 > no acute hemorrhage. CTA head 4/27 > occlusion of left MCA M2 segment, fusiform dilatation of anterior genu of right internal carotid artery. Echo 4/28 >  MRI brain 4/28 >   LINES / TUBES: Right CFA sheath 4/27 > 4/28 ETT 4/27 > A-line left radial 4/27 >>4/29   SIGNIFICANT EVENTS: 4/27 > admit, taken to IR but unsuccessful thrombectomy. 4/28 - Sedated on full vent support. MRI without midline shift. L MCA stroke +. No hge . restiration of blood flow + on MRA   SUBJECTIVE/OVERNIGHT/INTERVAL HX No events overnight, weaning  Goal SBP < 180  VITAL SIGNS: BP 117/82   Pulse 83   Temp 99.6 F (37.6 C) (Axillary)   Resp 12   Ht  (1.676 m)   Wt 91.1 kg (200 lb 13.4 oz)   SpO2 100%   BMI 32.42 kg/m   HEMODYNAMICS:    VENTILATOR SETTINGS: Vent Mode: PSV;CPAP FiO2 (%):  [30 %-40 %] 30 % Set Rate:  [12 bmp] 12 bmp Vt Set:  [510 mL] 510 mL PEEP:  [5 cmH20] 5 cmH20 Pressure Support:  [5 cmH20] 5 cmH20 Plateau Pressure:  [15 cmH20-18 cmH20] 16 cmH20  INTAKE / OUTPUT: I/O last 3 completed shifts: In: 803.5  [I.V.:803.5] Out: 2930 [Urine:2930]     EXAM  General Appearance:    Looks deconditioned, arousable but lethargic  Head:    Normocephalic, without obvious abnormality, atraumatic  Eyes:    PERRL - yes, conjunctiva/corneas - clear      Ears:    Normal external ear canals, both ears  Nose:   NG tube - no  Throat:  ETT TUBE - yes , OG tube - yes  Neck:   Supple,  No enlargement/tenderness/nodules     Lungs:     Clear to auscultation bilaterally, Ventilator   Synchrony - yes  Chest wall:    No deformity  Heart:    S1 and S2 normal, no murmur, CVP - no.  Pressors - no but on cleviprex  Abdomen:     Soft, no masses, no organomegaly  Genitalia:    Not done  Rectal:   not done  Extremities:   Extremities- mild edema     Skin:   Intact in exposed areas . Sacral area - no reported decub     Neurologic:   Sedation - none -> RASS - -1 . Moves all 4s - weak on right and aphasic. CAM-ICU - not test . Orientation - nodding appropriately       LABS  PULMONARY  Recent Labs Lab Oct 20, 2016 0118 Oct 20, 2016 0630  PHART  --  7.473*  PCO2ART  --  28.5*  PO2ART  --  162*  HCO3  --  20.6  TCO2 23  --   O2SAT  --  99.2    CBC  Recent Labs Lab 09/14/2016 0236 09/30/16 0510 10/02/16 0217  HGB 13.2 12.1* 12.2*  HCT 40.4 36.5* 37.6*  WBC 12.7* 10.0 9.5  PLT 184 161 164    COAGULATION  Recent Labs Lab 10/01/2016 0118  INR 1.18    CARDIAC    Recent Labs Lab 09/07/2016 0853 09/27/2016 1750 10/01/16 0430  TROPONINI 0.18* 0.21* 0.13*   No results for input(s): PROBNP in the last 168 hours.   CHEMISTRY  Recent Labs Lab 09/10/2016 0630 09/06/2016 1750 09/30/16 0510 09/30/16 1600 10/01/16 0430 10/02/16 0217  NA  --  140 140 142 141 141  K  --  3.4* 3.0* 4.4 3.9 3.7  CL  --  109 108 112* 112* 107  CO2  --  22 23 21* 21* 26  GLUCOSE  --  120* 125* 113* 98 109*  BUN  --  CREATININE  --  0.89 0.95 0.82 0.95 1.27*  CALCIUM  --  7.9* 8.0* 8.0* 8.0* 8.3*  MG 1.9  --   1.8  --   --  2.1  PHOS 1.6*  --  2.7  --   --  2.8   Estimated Creatinine Clearance: 45.7 mL/min (A) (by C-G formula based on SCr of 1.27 mg/dL (H)).  LIVER  Recent Labs Lab 09/28/2016 0118  AST 30  ALT 10*  ALKPHOS 61  BILITOT 0.6  PROT 6.9  ALBUMIN 3.9  INR 1.18   INFECTIOUS No results for input(s): LATICACIDVEN, PROCALCITON in the last 168 hours.  ENDOCRINE CBG (last 3)  No results for input(s): GLUCAP in the last 72 hours.  IMAGING x48h  - image(s) personally visualized  -   highlighted in bold Mr Shirlee Latch Wo Contrast  Result Date: 09/30/2016 CLINICAL DATA:  Followup M2 occlusion on the left with intervention. EXAM: MRI HEAD WITHOUT CONTRAST MRA HEAD WITHOUT CONTRAST TECHNIQUE: Multiplanar, multiecho pulse sequences of the brain and surrounding structures were obtained without intravenous contrast. Angiographic images of the head were obtained using MRA technique without contrast. COMPARISON:  Multiple exams 09/11/2016 FINDINGS: MRI HEAD FINDINGS Brain: There is acute infarction throughout much of the superior division left MCA territory with sparing of the basal ganglia. Region of infarction shows mild swelling. No sign of hemorrhage. Punctate acute infarction present in the right posterior parietal lobe medially. No other acute infarction. 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 There is old infarction in the right occipital lobe. Vascular: Major vessels at the base of the brain show flow. Skull and upper cervical spine: Negative Sinuses/Orbits: Paranasal sinus inflammatory changes. Orbits negative. Other: None MRA HEAD FINDINGS Considerable motion degradation. Both internal carotid arteries are widely patent through the skullbase. 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 Anterior and middle cerebral vessels are patent bilaterally. Restoration of flow in the major MCA branches post  intervention on the left. Both vertebral arteries are patent to the basilar. No basilar stenosis. Posterior circulation branch vessels are patent. IMPRESSION: Motion degraded exam. Acute infarction affecting much of the superior division left MCA territory. Swelling but no hemorrhage or mass effect. MR angiography appears to show restoration of flow in the major left middle cerebral branches. Electronically  Signed   By: Paulina Fusi M.D.   On: 09/30/2016 19:48   Mr Brain Wo Contrast  Result Date: 09/30/2016 CLINICAL DATA:  Followup M2 occlusion on the left with intervention. EXAM: MRI HEAD WITHOUT CONTRAST MRA HEAD WITHOUT CONTRAST TECHNIQUE: Multiplanar, multiecho pulse sequences of the brain and surrounding structures were obtained without intravenous contrast. Angiographic images of the head were obtained using MRA technique without contrast. COMPARISON:  Multiple exams 10/06/16 FINDINGS: MRI HEAD FINDINGS Brain: There is acute infarction throughout much of the superior division left MCA territory with sparing of the basal ganglia. Region of infarction shows mild swelling. No sign of hemorrhage. Punctate acute infarction present in the right posterior parietal lobe medially. No other acute infarction. 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 There is old infarction in the right occipital lobe. Vascular: Major vessels at the base of the brain show flow. Skull and upper cervical spine: Negative Sinuses/Orbits: Paranasal sinus inflammatory changes. Orbits negative. Other: None MRA HEAD FINDINGS Considerable motion degradation. Both internal carotid arteries are widely patent through the skullbase. 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 Anterior and middle cerebral vessels are patent bilaterally. Restoration of flow in the major MCA branches post intervention on the left. Both vertebral arteries are patent to the  basilar. No basilar stenosis. Posterior circulation branch vessels are patent. IMPRESSION: Motion degraded exam. Acute infarction affecting much of the superior division left MCA territory. Swelling but no hemorrhage or mass effect. MR angiography appears to show restoration of flow in the major left middle cerebral branches. Electronically Signed   By: Paulina Fusi M.D.   On: 09/30/2016 19:48   Dg Chest Port 1 View  Result Date: 10/02/2016 CLINICAL DATA:  Intubation. EXAM: PORTABLE CHEST 1 VIEW COMPARISON:  10/01/2016 . FINDINGS: Endotracheal tube and NG tube in good anatomic position. Cardiomegaly with mild basilar atelectasis. Small left pleural effusion. No pneumothorax. IMPRESSION: 1. Lines and tubes in stable position. 2. Low lung volumes with mild basilar atelectasis. Mild infiltrate left lung base cannot be excluded. Small left pleural effusion. 3. Stable cardiomegaly. Electronically Signed   By: Maisie Fus  Register   On: 10/02/2016 07:28   Dg Chest Port 1 View  Result Date: 10/01/2016 CLINICAL DATA:  Hypoxia EXAM: PORTABLE CHEST 1 VIEW COMPARISON:  September 30, 2016 FINDINGS: Endotracheal tube tip is 2.3 cm above the carina. Nasogastric tube tip and side port are in the stomach. No pneumothorax. There is left base atelectasis with small left pleural effusion. Right lung is clear. There is cardiomegaly with pulmonary vascularity within normal limits. No adenopathy. No bone lesions. There is aortic atherosclerosis. IMPRESSION: Tube positions as described without evident pneumothorax. Small left pleural effusion with left base atelectasis. No frank edema or consolidation. Stable cardiomegaly. There is aortic atherosclerosis. Electronically Signed   By: Bretta Bang III M.D.   On: 10/01/2016 07:32   ASSESSMENT and PLAN  Acute respiratory failure with hypoxemia:  - Wean on 5/5  - Need to contact family prior to extubation to determine plan of care  - DNR status noted, one way extubation when family  is available  - Titrate O2 for sat of 88-92%  Neuro: L MCA CVA  - Per stroke service  - Target SBP <180  Cardiac: A-fib and HTN  - Cleviprex drip  - HR control  Chronic CHF  - Lasix 2 more doses today  - K replacement  - Tele monitoring  GOC:  - DNR  - RN contacting family, will need to discuss plan of care.  GI Prophylaxis:  - TF  - PPI  DVT Prophylaxis:  - SCDs  FAMILY  - Updates: No family bedside today  - Inter-disciplinary family meet or Palliative Care meeting due by:  DAy 7. Current LOS is LOS 3 days  DISPO Keep in ICU   The patient is critically ill with multiple organ systems failure and requires high complexity decision making for assessment and support, frequent evaluation and titration of therapies, application of advanced monitoring technologies and extensive interpretation of multiple databases.   Critical Care Time devoted to patient care services described in this note is  35  Minutes. This time reflects time of care of this signee Dr Koren Bound. This critical care time does not reflect procedure time, or teaching time or supervisory time of PA/NP/Med student/Med Resident etc but could involve care discussion time.  Alyson Reedy, M.D. [facility] Pulmonary/Critical Care Medicine. Pager: [phone]. After hours pager: [phone].  10/02/2016 8:58 AM

## 2016-10-02 NOTE — Progress Notes (Signed)
Stopped by to visit, but no family were in rm and pt was being ministered to by med staff. Silent prayer was offered. Chaplain available for f/u.    10/02/16 1500  Clinical Encounter Type  Visited With Patient not available  Visit Type Initial;Psychological support;Spiritual support;Social support;Critical Care  Referral From Chaplain   Ephraim Hamburger, [place]
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
## 2016-10-03 ENCOUNTER — Ambulatory Visit: Payer: Medicare Other | Admitting: Podiatry

## 2016-10-03 ENCOUNTER — Inpatient Hospital Stay (HOSPITAL_COMMUNITY): Payer: Medicare Other

## 2016-10-03 DIAGNOSIS — Z7189 Other specified counseling: Secondary | ICD-10-CM

## 2016-10-03 LAB — BASIC METABOLIC PANEL
ANION GAP: 11 (ref 5–15)
BUN: 22 mg/dL — ABNORMAL HIGH (ref 6–20)
CHLORIDE: 106 mmol/L (ref 101–111)
CO2: 25 mmol/L (ref 22–32)
Calcium: 8.7 mg/dL — ABNORMAL LOW (ref 8.9–10.3)
Creatinine, Ser: 1.28 mg/dL — ABNORMAL HIGH (ref 0.61–1.24)
GFR calc Af Amer: 58 mL/min — ABNORMAL LOW (ref >60)
GFR, EST NON AFRICAN AMERICAN: 50 mL/min — AB (ref >60)
Glucose, Bld: 101 mg/dL — ABNORMAL HIGH (ref 65–99)
POTASSIUM: 3.7 mmol/L (ref 3.5–5.1)
SODIUM: 142 mmol/L (ref 135–145)

## 2016-10-03 LAB — CBC WITH DIFFERENTIAL/PLATELET
Basophils Absolute: 0 10*3/uL (ref 0.0–0.1)
Basophils Relative: 0 %
Eosinophils Absolute: 0.2 10*3/uL (ref 0.0–0.7)
Eosinophils Relative: 3 %
HEMATOCRIT: 37.9 % — AB (ref 39.0–52.0)
HEMOGLOBIN: 12.6 g/dL — AB (ref 13.0–17.0)
LYMPHS PCT: 13 %
Lymphs Abs: 1 10*3/uL (ref 0.7–4.0)
MCH: 29.6 pg (ref 26.0–34.0)
MCHC: 33.2 g/dL (ref 30.0–36.0)
MCV: 89 fL (ref 78.0–100.0)
Monocytes Absolute: 1 10*3/uL (ref 0.1–1.0)
Monocytes Relative: 13 %
NEUTROS ABS: 5.7 10*3/uL (ref 1.7–7.7)
Neutrophils Relative %: 71 %
PLATELETS: 180 10*3/uL (ref 150–400)
RBC: 4.26 MIL/uL (ref 4.22–5.81)
RDW: 14.1 % (ref 11.5–15.5)
WBC: 7.9 10*3/uL (ref 4.0–10.5)

## 2016-10-03 LAB — POCT I-STAT 3, ART BLOOD GAS (G3+)
Acid-Base Excess: 1 mmol/L (ref 0.0–2.0)
Bicarbonate: 24.5 mmol/L (ref 20.0–28.0)
O2 Saturation: 99 %
PCO2 ART: 33.7 mmHg (ref 32.0–48.0)
PH ART: 7.471 — AB (ref 7.350–7.450)
PO2 ART: 108 mmHg (ref 83.0–108.0)
Patient temperature: 98.9
TCO2: 26 mmol/L (ref 0–100)

## 2016-10-03 LAB — PHOSPHORUS: PHOSPHORUS: 3.3 mg/dL (ref 2.5–4.6)

## 2016-10-03 LAB — MAGNESIUM: Magnesium: 2.2 mg/dL (ref 1.7–2.4)

## 2016-10-03 MED ORDER — FUROSEMIDE 10 MG/ML IJ SOLN
40.0000 mg | Freq: Once | INTRAMUSCULAR | Status: AC
  Administered 2016-10-03: 40 mg via INTRAVENOUS
  Filled 2016-10-03: qty 4

## 2016-10-03 MED ORDER — POTASSIUM CHLORIDE 20 MEQ/15ML (10%) PO SOLN
40.0000 meq | Freq: Once | ORAL | Status: AC
  Administered 2016-10-03: 40 meq
  Filled 2016-10-03: qty 30

## 2016-10-03 MED ORDER — OSMOLITE 1.2 CAL PO LIQD
1000.0000 mL | ORAL | Status: DC
  Filled 2016-10-03 (×2): qty 1000

## 2016-10-03 MED ORDER — SODIUM CHLORIDE 0.9 % IV SOLN
10.0000 mg/h | INTRAVENOUS | Status: DC
  Administered 2016-10-03: 10 mg/h via INTRAVENOUS
  Administered 2016-10-04: 17.5 mg/h via INTRAVENOUS
  Administered 2016-10-04 – 2016-10-05 (×2): 18.5 mg/h via INTRAVENOUS
  Filled 2016-10-03 (×3): qty 10

## 2016-10-03 MED ORDER — MORPHINE BOLUS VIA INFUSION
5.0000 mg | INTRAVENOUS | Status: DC | PRN
  Administered 2016-10-03: 5 mg via INTRAVENOUS
  Administered 2016-10-04: 10 mg via INTRAVENOUS
  Filled 2016-10-03: qty 20

## 2016-10-03 MED ORDER — FENTANYL CITRATE (PF) 100 MCG/2ML IJ SOLN
25.0000 ug | INTRAMUSCULAR | Status: DC | PRN
  Administered 2016-10-03: 50 ug via INTRAVENOUS
  Filled 2016-10-03: qty 2

## 2016-10-03 MED ORDER — PRO-STAT SUGAR FREE PO LIQD: 30.0000 mL | Freq: Three times a day (TID) | ORAL | Status: DC

## 2016-10-03 NOTE — Progress Notes (Signed)
Pt has had difficulty controlling oral secretions since extubation with a rapid progression at appx 1745.  Order obtained to NT suction and give PRN Fentanyl from Dr. Roxy Manns, order carried out.  Pt still tachypneic and tachycardic despite these actions.  Pt mentation declining as well. Elink notified, as well as family.  Family agrees to full comfort care.  Will pursue comfort care once family has had discussion with MD.

## 2016-10-03 NOTE — Progress Notes (Signed)
SLP Cancellation Note  Patient Details Name: Ricky Guzman MRN: [id number] DOB: 1931/03/12   Cancelled treatment:       Reason Eval/Treat Not Completed: Medical issues which prohibited therapy. Pt remains intubated. Per chart, plan is for possible extubation but he needs time to become more alert as he received sedation overnight. Will continue to follow.   Maxcine Ham 10/03/2016, 8:43 AM  Maxcine Ham, M.A. CCC-SLP [phone]

## 2016-10-03 NOTE — Procedures (Signed)
Extubation Procedure Note  Patient Details:   Name: Ricky Guzman DOB: 1931-06-04 MRN: [id number]   Airway Documentation:     Evaluation O2 sats: stable throughout Complications: No apparent complications Patient did tolerate procedure well. Bilateral Breath Sounds: Diminished   Patient was extubated per MD order to a nasal cannula. Patient had been weaning all morning and was tolerating his wean well. Breath sounds post extubation were diminished and no stridor nor signs of respiratory distress have been noted. Patient has a weak cough. Patient had a positive leak test. Hr-101, Spo2- 99%, RR-21, Bp- 139/111 Rt will continue to monitor and assist as needed.   Idell Pickles 10/03/2016, 1133 AM
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
## 2016-10-03 NOTE — Progress Notes (Signed)
Physical Therapy Treatment Patient Details Name: Ricky Guzman MRN: [id number] DOB: 07-May-1931 Today's Date: 10/03/2016    History of Present Illness Ricky Guzman a 83 y.o.malewith PMHx of HTN, arrhythmia,atrial fibrilation, brought in by ambulance who presents to the ED on 10/25/16 with c/oacute right-sided weakness, aphasia, and R facial droop. CT shows L MCA occlusion and dilation of R ICA; old R occipital infarct. CXR shows L lower lobe atelectasis or pneumonia. Thrombectomy unsuccessful on 4/27. Extubated 5/1.   PT Comments    Pt slowly progressing with mobility; extubated this AM and currently non-verbal. Performed bed mobility, stood and took side steps towards [facility] with maxA+2; decreased initiation with all mobility, requiring max multimodal cues. Continues to demonstrate R-side inattention; demonstrates movement in RUE and RLE, but not to command. Motivated to work with PT. Will continue to follow acutely. D/c plan updated to CIR level therapies.   Follow Up Recommendations  CIR;Supervision/Assistance - 24 hour     Equipment Recommendations  Other (comment) (TBD)    Recommendations for Other Services       Precautions / Restrictions Precautions Precautions: Fall Restrictions Weight Bearing Restrictions: No    Mobility  Bed Mobility Overal bed mobility: Needs Assistance Bed Mobility: Supine to Sit;Sit to Supine     Supine to sit: Max assist;+2 for physical assistance Sit to supine: Max assist;+2 for physical assistance   General bed mobility comments: Pt with some initiation of transfer with increased time, requiring max multimodal cues; maxA +2 for bilat LE management and trunk elevation.  Transfers Overall transfer level: Needs assistance Equipment used: None Transfers: Sit to/from Stand Sit to Stand: +2 physical assistance;Max assist         General transfer comment: Stood with maxA+2, requiring max cues for hip extension to achieve fully upright  posture.  Ambulation/Gait Ambulation/Gait assistance: Max assist;+2 physical assistance Ambulation Distance (Feet): 1 Feet Assistive device: None Gait Pattern/deviations: Step-to pattern Gait velocity: Decreased   General Gait Details: Took a couple shuffling side steps towards HOB with maxA+2 and max multimodal cues; able to initate steps with bilateral LEs with significant lean towards R-side.   Stairs            Wheelchair Mobility    Modified Rankin (Stroke Patients Only) Modified Rankin (Stroke Patients Only) Pre-Morbid Rankin Score: No symptoms Modified Rankin: Severe disability     Balance Overall balance assessment: Needs assistance Sitting-balance support: Feet unsupported;No upper extremity supported Sitting balance-Leahy Scale: Fair   Postural control: Right lateral lean Standing balance support: No upper extremity supported;During functional activity Standing balance-Leahy Scale: Zero Standing balance comment: MaxA+2 for standing                             Cognition Arousal/Alertness: Lethargic Behavior During Therapy: Flat affect Overall Cognitive Status: Impaired/Different from baseline Area of Impairment: Attention;Following commands;Safety/judgement;Problem solving;Awareness                   Current Attention Level: Sustained   Following Commands: Follows one step commands inconsistently Safety/Judgement: Decreased awareness of deficits Awareness: Intellectual Problem Solving: Slow processing;Decreased initiation;Difficulty sequencing;Requires verbal cues;Requires tactile cues General Comments: Pt extubated this A.M. but remains non-verbal. Follows some one step commands with increased time requiring max multimodal cues. Purposeful movement in RUE and RLE, but not to command. L-side gaze preference, able to track just beyond midline towards R-side with max cues. Demonstrates R inattention.      Exercises  General  Comments        Pertinent Vitals/Pain Pain Assessment: Faces Faces Pain Scale: No hurt    Home Living                      Prior Function            PT Goals (current goals can now be found in the care plan section) Acute Rehab PT Goals Patient Stated Goal: Unable to state PT Goal Formulation: Patient unable to participate in goal setting Time For Goal Achievement: 10/15/16 Potential to Achieve Goals: Fair Progress towards PT goals: Progressing toward goals    Frequency    Min 4X/week      PT Plan Frequency needs to be updated;Discharge plan needs to be updated    Co-evaluation              AM-PAC PT "6 Clicks" Daily Activity  Outcome Measure  Difficulty turning over in bed (including adjusting bedclothes, sheets and blankets)?: Total Difficulty moving from lying on back to sitting on the side of the bed? : Total Difficulty sitting down on and standing up from a chair with arms (e.g., wheelchair, bedside commode, etc,.)?: Total Help needed moving to and from a bed to chair (including a wheelchair)?: Total Help needed walking in hospital room?: Total Help needed climbing 3-5 steps with a railing? : Total 6 Click Score: 6    End of Session Equipment Utilized During Treatment: Gait belt Activity Tolerance: Patient tolerated treatment well Patient left: in bed;with call bell/phone within reach;with family/visitor present;with bed alarm set Nurse Communication: Mobility status PT Visit Diagnosis: Hemiplegia and hemiparesis;Other abnormalities of gait and mobility (R26.89) Hemiplegia - Right/Left: Right     Time: 4782-9562 PT Time Calculation (min) (ACUTE ONLY): 27 min  Charges:  $Therapeutic Activity: 23-37 mins                    G Codes:      Dewayne Hatch, SPT [phone]  Ina Homes 10/03/2016, 5:36 PM

## 2016-10-03 NOTE — Progress Notes (Signed)
eLink Physician-Brief Progress Note Patient Name: Thaddaeus Granja DOB: 09-Nov-1930 MRN: [id number]   Date of Service  10/03/2016  HPI/Events of Note  The patient was extubated earlier today and reportedly did Ok for several hours before developing respiratory distress with RR in 40's and increased WOB. Fentanyl IV PRN improved his distress. Plan discussed previously with the family was for comfort care should he fail extubation. I suspect that he isn't able to protect his airway, mobilize respiratory secretions and can't handle oral secretions d/t his large L MCA distrubution infarct. He is currently DNR/DNI and his prognosis is extremely poor. Spoke with his daughter. Lauree Chandler, who understands his poor prognosis and request that we keep her father comfortable and allow him to pass with comfort and dignity.   eICU Interventions  Will make patient comfort measures per withdrawal of life sustaining therapy protocol.      Intervention Category Major Interventions: End of life / care limitation discussion  Lenell Antu 10/03/2016, 7:07 PM

## 2016-10-03 NOTE — Progress Notes (Signed)
Noted comfort care. Will not pursue therapy recommendations for rehab consult at this time. [phone]

## 2016-10-03 NOTE — Progress Notes (Signed)
Nutrition Follow-up  DOCUMENTATION CODES:   Obesity unspecified  INTERVENTION:   Osmolite 1.2 @ 55 ml/hr (1320 ml/day) 30 ml Prostat TID Provides: 1884 kcal, 118 grams protein, and 1070 ml free water  NUTRITION DIAGNOSIS:   Inadequate oral intake related to inability to eat as evidenced by NPO status. Ongoing.   GOAL:   Patient will meet greater than or equal to 90% of their needs Progressing.   MONITOR:   Diet advancement, TF tolerance  ASSESSMENT:   Pt with hx of HTN and a.fib who was admitted with occlusion of L MCA thrombectomy unsuccessful due to anatomy.   Pt had one-way extubation today, not ready for swallow eval due to lethargy. Plan for NG (Cortrak service not available).  Discussed with RN.  Pt has been 4 days without TF.   Diet Order:  Diet NPO time specified  Skin:  Reviewed, no issues  Last BM:  5/1  Height:   Ht Readings from Last 1 Encounters:  09/30/2016  (1.676 m)    Weight:   Wt Readings from Last 1 Encounters:  09/13/2016 200 lb 13.4 oz (91.1 kg)    Ideal Body Weight:  64.5 kg  BMI:  Body mass index is 32.42 kg/m.  Estimated Nutritional Needs:   Kcal:  1800-2000  Protein:  100-115 grams  Fluid:  2 L/day  EDUCATION NEEDS:   No education needs identified at this time  Kendell Bane RD, LDN, CNSC [phone] Pager [phone] After Hours Pager

## 2016-10-03 NOTE — Progress Notes (Signed)
PULMONARY / CRITICAL CARE MEDICINE   Name: Ricky Guzman MRN: [id number] DOB: 12/03/1930    ADMISSION DATE:  09/07/2016 CONSULTATION DATE:  09/21/2016  REFERRING MD:  Otelia Limes  CHIEF COMPLAINT:  AMS  brief Pt is encephelopathic; therefore, this HPI is obtained from chart review. Ricky Guzman is a 83 y.o. male with PMH as outlined below.  He was found at 0030 4/27 with R-sided weakness and R-sided facial droop in addition to being aphasic and non-verbal. LSN by family at 2230 the night prior. Family denies any head injury or trauma and felt that pt was in his usual state of health the night prior. CTA with occlusion of left MCA M2-segment. Seen by neurology and IR who attempted percutaneous left MCA thrombectomy however was unsuccessful 2/2 patient anatomy. He then returned to ICU on vent and PCCM asked to assist with vent management.  STUDIES: CT head 4/27 > no acute hemorrhage. CTA head 4/27 > occlusion of left MCA M2 segment, fusiform dilatation of anterior genu of right internal carotid artery. Echo 4/28 > EF 40%, LVH MRI brain 4/28 > acute left mca infarct  LINES / TUBES: Right CFA sheath 4/27 > 4/28 ETT 4/27 > A-line left radial 4/27 >>4/29   SIGNIFICANT EVENTS: 4/27 > admit, taken to IR but unsuccessful thrombectomy. 4/28 - Sedated on full vent support. MRI without midline shift. L MCA stroke +. No hge . restiration of blood flow + on MRA 5/1 one way extubation is plan once sedation wears off.   SUBJECTIVE/OVERNIGHT/INTERVAL HX Sedated x 2 overnight ?why.  Goal SBP < 180  VITAL SIGNS: BP (!) 149/99   Pulse 88   Temp 99.3 F (37.4 C) (Axillary)   Resp 12   Ht  (1.676 m)   Wt 200 lb 13.4 oz (91.1 kg)   SpO2 100%   BMI 32.42 kg/m   HEMODYNAMICS:    VENTILATOR SETTINGS: Vent Mode: CPAP;PSV FiO2 (%):  [30 %] 30 % Set Rate:  [12 bmp] 12 bmp Vt Set:  [510 mL] 510 mL PEEP:  [5 cmH20] 5 cmH20 Pressure Support:  [5 cmH20-8 cmH20] 8 cmH20 Plateau Pressure:   [16 cmH20-17 cmH20] 17 cmH20  INTAKE / OUTPUT: I/O last 3 completed shifts: In: 208.2 [I.V.:208.2] Out: 2100 [Urine:2100]     EXAM  LABS  PULMONARY  Recent Labs Lab 09/19/2016 0118 09/05/2016 0630 10/03/16 0304  PHART  --  7.473* 7.471*  PCO2ART  --  28.5* 33.7  PO2ART  --  162* 108.0  HCO3  --  20.6 24.5  TCO2 23  --  26  O2SAT  --  99.2 99.0    CBC  Recent Labs Lab 09/30/16 0510 10/02/16 0217 10/03/16 0605  HGB 12.1* 12.2* 12.6*  HCT 36.5* 37.6* 37.9*  WBC 10.0 9.5 7.9  PLT 161 164 180    COAGULATION  Recent Labs Lab 09/09/2016 0118  INR 1.18    CARDIAC    Recent Labs Lab 10/01/2016 0853 09/19/2016 1750 10/01/16 0430  TROPONINI 0.18* 0.21* 0.13*   No results for input(s): PROBNP in the last 168 hours.   CHEMISTRY  Recent Labs Lab 09/24/2016 0630  09/30/16 0510 09/30/16 1600 10/01/16 0430 10/02/16 0217 10/03/16 0605  NA  --   < > 140 142 141 141 142  K  --   < > 3.0* 4.4 3.9 3.7 3.7  CL  --   < > 108 112* 112* 107 106  CO2  --   < > 23 21* 21*  26 25  GLUCOSE  --   < > 125* 113* 98 109* 101*  BUN  --   < > 22*  CREATININE  --   < > 0.95 0.82 0.95 1.27* 1.28*  CALCIUM  --   < > 8.0* 8.0* 8.0* 8.3* 8.7*  MG 1.9  --  1.8  --   --  2.1 2.2  PHOS 1.6*  --  2.7  --   --  2.8 3.3  < > = values in this interval not displayed. Estimated Creatinine Clearance: 45.4 mL/min (A) (by C-G formula based on SCr of 1.28 mg/dL (H)).  LIVER  Recent Labs Lab 09/15/2016 0118  AST 30  ALT 10*  ALKPHOS 61  BILITOT 0.6  PROT 6.9  ALBUMIN 3.9  INR 1.18   INFECTIOUS No results for input(s): LATICACIDVEN, PROCALCITON in the last 168 hours.  ENDOCRINE CBG (last 3)  No results for input(s): GLUCAP in the last 72 hours.  IMAGING x48h  - image(s) personally visualized  -   highlighted in bold Dg Chest Port 1 View  Result Date: 10/03/2016 CLINICAL DATA:  Acute respiratory failure, intubated patient. History of CHF. EXAM: PORTABLE CHEST 1 VIEW  COMPARISON:  Portable chest x-ray of October 02, 2016 FINDINGS: The lungs are reasonably well inflated. The retrocardiac region on the left is slightly less dense today. A small left pleural effusion blunts the costophrenic angle laterally. The cardiac silhouette remains enlarged. The pulmonary vascularity is not engorged. There is calcification in the wall of the aortic arch. The endotracheal tube tip lies approximately 1.9 cm above the carina. The esophagogastric tube tip in proximal port project below the GE junction. IMPRESSION: Slight improvement in left lower lobe atelectasis or pneumonia. Stable small left pleural effusion. Stable cardiomegaly without pulmonary vascular congestion. The support tubes are in reasonable position. Thoracic aortic atherosclerosis. Electronically Signed   By: David  [place] M.D.   On: 10/03/2016 07:41   Dg Chest Port 1 View  Result Date: 10/02/2016 CLINICAL DATA:  Intubation. EXAM: PORTABLE CHEST 1 VIEW COMPARISON:  10/01/2016 . FINDINGS: Endotracheal tube and NG tube in good anatomic position. Cardiomegaly with mild basilar atelectasis. Small left pleural effusion. No pneumothorax. IMPRESSION: 1. Lines and tubes in stable position. 2. Low lung volumes with mild basilar atelectasis. Mild infiltrate left lung base cannot be excluded. Small left pleural effusion. 3. Stable cardiomegaly. Electronically Signed   By: Maisie Fus  Register   On: 10/02/2016 07:28   ASSESSMENT and PLAN  Acute respiratory failure with hypoxemia:  - Wean as tolerated  - One way extubation is plan  - DNR status noted  - Titrate O2 for sat of 88-92%             - hold sedation to promote optimal chance of successful liberation from MVS 5/1  Neuro: L MCA CVA  - Per stroke team  -Target SBP <180  Cardiac: A-fib with uner anticogulationand HTN  - Cleviprex drip  - HR control  Chronic CHF  - Lasix x 1 5/1  -K replacement  - Tele monitoring  GOC:  - DNR  - RN contacting family, will need to  discuss plan of care.             - One way extubation is plan but received fentanyl c 2 during early am of 5/1, therefore will leave on PSV till more awake.   GI Prophylaxis:  - TF  - PPI  DVT Prophylaxis:  -  SCDs  FAMILY  - Updates: Updated 5/1 per SM  - Inter-disciplinary family meet or Palliative Care meeting due by:  DAy 7. Current LOS is LOS 4 days  DISPO Keep in ICU   App cct 30 min  Brett Canales Minor ACNP Adolph Pollack PCCM Pager [phone] till 3 pm If no answer page [phone] 10/03/2016, 8:00 AM  Attending Note:  83 year old male s/p CVA who was intubated for airway protection.  Patient is currently much improved and is weaning.  We had an extensive conversation with family on 4/30.  After discussion, decision was made that patient would not want trach/peg.  On exam, lungs are clear and patient is much more awake today than he was prior.  I reviewed CXR myself, ETT in good position.  Will extubate patient.  Make patient a full DNR as trach/peg not an option.  If decompensates post extubation then will transition to comfort care.  The patient is critically ill with multiple organ systems failure and requires high complexity decision making for assessment and support, frequent evaluation and titration of therapies, application of advanced monitoring technologies and extensive interpretation of multiple databases.   Critical Care Time devoted to patient care services described in this note is  35  Minutes. This time reflects time of care of this signee Dr Koren Bound. This critical care time does not reflect procedure time, or teaching time or supervisory time of PA/NP/Med student/Med Resident etc but could involve care discussion time.  Alyson Reedy, M.D. [facility] Pulmonary/Critical Care Medicine. Pager: [phone]. After hours pager: [phone].

## 2016-10-03 DEATH — deceased

## 2016-10-04 DIAGNOSIS — Z515 Encounter for palliative care: Secondary | ICD-10-CM

## 2016-10-04 DIAGNOSIS — G9340 Encephalopathy, unspecified: Secondary | ICD-10-CM

## 2016-10-04 MED ORDER — SCOPOLAMINE 1 MG/3DAYS TD PT72
1.0000 | MEDICATED_PATCH | TRANSDERMAL | Status: DC
  Administered 2016-10-04: 1.5 mg via TRANSDERMAL
  Filled 2016-10-04: qty 1

## 2016-10-04 NOTE — Progress Notes (Signed)
RN called into room. Pt's family states pt looks different. Pt appears same to me. He does not subjectively look uncomfortable. Still with considerable oral secretions. More oral care given. Continue morphing gtt at current settings.

## 2016-10-04 NOTE — Progress Notes (Signed)
STROKE TEAM PROGRESS NOTE   HISTORY OF PRESENT ILLNESS (per record) Ricky Guzman is an 83 y.o. male who presented with right hemiplegia and aphasia. He was LKN at 2230 and found by family with above symptoms at 0030. BP on EMS arrival was 196/102, with CBG 126. Code Stroke was initiated en route to the ED.  His PMHx includes atrial fibrillation and HTN. On Eliquis 2.5 Mg BID PTA - under dosed.  LSN: 2230 tPA Given: No: Out of 3 hour IV tPA time window for patients > 87 years of age.    SUBJECTIVE (INTERVAL HISTORY) His RN is  at the bedside.  Pt extubated y`day but developed resp distress y`day evening and after d/w family made comfort care and started on morphine drip now. No family available at bedside this morning. Temp:  [99.7 F (37.6 C)-100.1 F (37.8 C)] 99.7 F (37.6 C) (05/01 2000) Pulse Rate:  [43-146] 77 (05/02 1025) Cardiac Rhythm: Atrial fibrillation (05/01 2000) Resp:  [16-31] 16 (05/02 1025) BP: (146-179)/(83-115) 158/83 (05/02 0835) SpO2:  [90 %-100 %] 90 % (05/02 1025)  CBC:   Recent Labs Lab 10/02/16 0217 10/03/16 0605  WBC 9.5 7.9  NEUTROABS 6.8 5.7  HGB 12.2* 12.6*  HCT 37.6* 37.9*  MCV 89.5 89.0  PLT 164 180    Basic Metabolic Panel:   Recent Labs Lab 10/02/16 0217 10/03/16 0605  NA 141 142  K 3.7 3.7  CL 107 106  CO2 26 25  GLUCOSE 109* 101*  BUN 11 22*  CREATININE 1.27* 1.28*  CALCIUM 8.3* 8.7*  MG 2.1 2.2  PHOS 2.8 3.3    Lipid Panel:     Component Value Date/Time   CHOL 137 16-Oct-2016 0545   TRIG 118 10-16-2016 0545   HDL 33 (L) 2016/10/16 0545   CHOLHDL 4.2 2016-10-16 0545   VLDL 24 10/16/2016 0545   LDLCALC 80 2016/10/16 0545   HgbA1c:  Lab Results  Component Value Date   HGBA1C 5.8 (H) 10-16-2016   Urine Drug Screen: No results found for: LABOPIA, COCAINSCRNUR, LABBENZ, AMPHETMU, THCU, LABBARB  Alcohol Level No results found for: [facility]   IMAGING I have personally reviewed the radiological images below and agree with  the radiology interpretations.  Ct Angio Head W Or Wo Contrast Ct Angio Neck W And/or Wo Contrast 10-16-16 1. 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 Attenuated distal left MCA distribution vascularity.  2. Fusiform dilatation of the anterior genu of the Rt ICA cavernous segment, measuring 8 mm.  3. Normal cervical CTA.  4. Aortic atherosclerosis.   Ir Percutaneous Art Thrombectomy/infusion Intracranial Inc Diag Angio Ir US Guide Vasc Access Left  Oct 16, 2016 Status post left cerebral angiogram demonstrating common origin of the left common carotid artery from the innominate artery as well as significantly tortuous proximal left ICA, precluding placement of catheter system into the MCA with inability to treat the M2 occlusion.     Portable Chest Xray Oct 16, 2016 Low volumes with left lung central basilar atelectasis. Prominent mediastinum likely due to low volumes. Endotracheal tube. Cardiomegaly without decompensation.    Ct Head Code Stroke W/o Cm 10/16/16   1. No acute intracranial hemorrhage.  2. Old right occipital infarct, chronic microvascular ischemia and cerebral atrophy.  3. ASPECTS is 10.   TTE Left ventricle: The cavity size was normal. Systolic function was   mildly reduced. The estimated ejection fraction was in the range   of 45%  to 50%. Diffuse hypokinesis MRI  Motion degraded exam. Acute infarction affecting much of the superior division left MCA territory. Swelling but no hemorrhage or mass effect. MRA : appears to show restoration of flow in the major left middle cerebral branches. PHYSICAL EXAM  Temp:  [99.7 F (37.6 C)-100.1 F (37.8 C)] 99.7 F (37.6 C) (05/01 2000) Pulse Rate:  [43-146] 77 (05/02 1025) Resp:  [16-31] 16 (05/02 1025) BP: (146-179)/(83-115) 158/83 (05/02 0835) SpO2:  [90 %-100 %] 90 % (05/02 1025)  General - Well nourished, well developed, elderly male    Ophthalmologic - Fundi not visualized.  Cardiovascular - irregularly irregular heart rate and rhythm.  Neuro - extubated on morphine drip for  sedation, barely open eyes on voice and pain,  Not  following  any commands. Left eye gaze preference, PERRL, doll's eyes positive, not blinking to visual threat with forced eye opening, not tracking. Right corneal absent, but left corneal and gag positive. On pain stimulation, withdraws minimally all extremities left more than right   at least 2+/5. DTR 1+ and bilateral babinski positive. sensation, coordination and gait not tested.    ASSESSMENT/PLAN [name] is a 83 y.o. male with history of small bowel obstruction, rectal bleeding, hypertension, atrial fibrillation (Eliquis - under dosed), and old stroke by imaging presenting with right hemiplegia and aphasia. He did not receive IV t-PA due to late presentation. Attempted but unsuccessful thrombectomy.  Stroke:  Left MCA infarct with M1/M2 junction occlusion - embolic - secondary to afib with under-dosed eliquis  Resultant aphasia  MRI  - Motion degraded exam. Acute infarction affecting much of the superior division left MCA territory. Swelling but no hemorrhage or mass effect.   MRA -  appears to show restoration of flow in the major left  middle cerebral branches.  CTA head and neck - left M1/M2 occlusion, right ICA cavernous fusiform dilation    IR attempted but aborted due to tortuosity of the left ICA 2D Echo -Left ventricle: The cavity size was normal. Systolic function was   mildly reduced. The estimated ejection fraction was in the range    of 45% to 50%. Diffuse hypokinesis  LDL - 80  HgbA1c - 5.8  VTE prophylaxis - SCDs Diet NPO time specified  eliquis 2.5mg  bid prior to admission, now on aspirin 325 mg daily.  Patient counseled to be compliant with his antithrombotic medications  Ongoing aggressive stroke risk factor management  Therapy recommendations:  pending  Disposition: Pending  afib on eliquis  Pt eliquis 2.5mg  bid PTA was under dosed as pt weight > 60Kg and Cre < 1.5.   Hold off anticoagulation for now pending MRI to evaluate infarct size  On ASA  PR  Consider resuming Eliquis  bid once appropirate.  Respiratory failure  Intubated for intervention  CCM on board  Evaluating possible extubation after sheath removal.   Hypertension  Stable  Permissive hypertension (OK if < 180/105 due to groin sheath) but gradually normalize in 5-7 days  Long-term BP goal 130-150 due to left MCA M1/M2 occlusion  Hyperlipidemia  Home meds: No lipid lowering medications prior to admission  LDL 80, goal < 70  Continue Pravachol 20 mg daily  Continue statin at discharge  Other Stroke Risk Factors  Advanced age  ETOH use, advised to drink no more than 1drink per day  Obesity, Body mass index is 32.42 kg/m., recommend weight loss, diet and exercise as appropriate   Hx stroke/TIA - by imaging  Atrial  fib  Other Active Problems  Fusiform dilatation of the anterior genu of the Rt ICA cavernous segment, measuring 8 mm.  Mild leukocytosis - 12.7 -> 10.0 oral temp 98.6  Hypokalemia - 3.2 -> 3.4 -> 3.0 -> supplement -> recheck in AM  Hypophosphatemia - 1.6  Elevated creatinine - 1.30 ->1.44 -> 0.89 -> 0.95  NPO -> NG tube  Intubated  Hospital day # 5 Plan  Family ok with plan for comfort care and morphine drip. Transfer to hospice floor bed. Delia Heady, MD  Medical Director [facility] Pager: [phone] 10/04/2016 12:55 PM  Phone [phone] Fax [phone] To contact Stroke Continuity provider, please refer to [email or web address]. After hours, contact General Neurology

## 2016-10-04 NOTE — Progress Notes (Signed)
Pt transferred to 6N15 around 1230. Morphine bag given to receiving RN.

## 2016-10-04 NOTE — Progress Notes (Signed)
PULMONARY / CRITICAL CARE MEDICINE   Name: Ricky Guzman MRN: [id number] DOB: 08/30/1930    ADMISSION DATE:  09/06/2016 CONSULTATION DATE:  09/05/2016  REFERRING MD:  Otelia Limes  CHIEF COMPLAINT:  AMS  brief Pt is encephelopathic; therefore, this HPI is obtained from chart review. Ricky Guzman is a 83 y.o. male with PMH as outlined below.  He was found at 0030 4/27 with R-sided weakness and R-sided facial droop in addition to being aphasic and non-verbal. LSN by family at 2230 the night prior. Family denies any head injury or trauma and felt that pt was in his usual state of health the night prior. CTA with occlusion of left MCA M2-segment. Seen by neurology and IR who attempted percutaneous left MCA thrombectomy however was unsuccessful 2/2 patient anatomy. He then returned to ICU on vent and PCCM asked to assist with vent management.  STUDIES: CT head 4/27 > no acute hemorrhage. CTA head 4/27 > occlusion of left MCA M2 segment, fusiform dilatation of anterior genu of right internal carotid artery. Echo 4/28 > EF 40%, LVH MRI brain 4/28 > acute left mca infarct  LINES / TUBES: Right CFA sheath 4/27 > 4/28 ETT 4/27 >5/1 A-line left radial 4/27 >>4/29   SIGNIFICANT EVENTS: 4/27 > admit, taken to IR but unsuccessful thrombectomy. 4/28 - Sedated on full vent support. MRI without midline shift. L MCA stroke +. No hge . restiration of blood flow + on MRA 5/1 one way extubation is plan once sedation wears off. 5/2 mso4 drip and full palliation in place  SUBJECTIVE/OVERNIGHT/INTERVAL HX Sedated x 2 overnight ?why.  Goal SBP < 180  VITAL SIGNS: BP (!) 172/111 Comment: comfort care initiated; assessed prior to morphine gtt start  Pulse (!) 146   Temp 99.7 F (37.6 C) (Axillary)   Resp (!) 26   Ht  (1.676 m)   Wt 200 lb 13.4 oz (91.1 kg)   SpO2 93%   BMI 32.42 kg/m   HEMODYNAMICS:    VENTILATOR SETTINGS: Vent Mode: CPAP;PSV FiO2 (%):  [30 %] 30 % PEEP:  [5 cmH20] 5  cmH20 Pressure Support:  [5 cmH20] 5 cmH20  INTAKE / OUTPUT: I/O last 3 completed shifts: In: 133.7 [I.V.:133.7] Out: 1636 [Urine:1635; Stool:1]  EXAM  LABS  PULMONARY  Recent Labs Lab 09/21/2016 0118 10/02/2016 0630 10/03/16 0304  PHART  --  7.473* 7.471*  PCO2ART  --  28.5* 33.7  PO2ART  --  162* 108.0  HCO3  --  20.6 24.5  TCO2 23  --  26  O2SAT  --  99.2 99.0   CBC  Recent Labs Lab 09/30/16 0510 10/02/16 0217 10/03/16 0605  HGB 12.1* 12.2* 12.6*  HCT 36.5* 37.6* 37.9*  WBC 10.0 9.5 7.9  PLT 161 164 180   COAGULATION  Recent Labs Lab 09/13/2016 0118  INR 1.18   CARDIAC  Recent Labs Lab 09/17/2016 0853 09/03/2016 1750 10/01/16 0430  TROPONINI 0.18* 0.21* 0.13*   No results for input(s): PROBNP in the last 168 hours.   CHEMISTRY  Recent Labs Lab 09/10/2016 0630  09/30/16 0510 09/30/16 1600 10/01/16 0430 10/02/16 0217 10/03/16 0605  NA  --   < > 140 142 141 141 142  K  --   < > 3.0* 4.4 3.9 3.7 3.7  CL  --   < > 108 112* 112* 107 106  CO2  --   < > 23 21* 21* 26 25  GLUCOSE  --   < > 125* 113*  98 109* 101*  BUN  --   < > 22*  CREATININE  --   < > 0.95 0.82 0.95 1.27* 1.28*  CALCIUM  --   < > 8.0* 8.0* 8.0* 8.3* 8.7*  MG 1.9  --  1.8  --   --  2.1 2.2  PHOS 1.6*  --  2.7  --   --  2.8 3.3  < > = values in this interval not displayed. Estimated Creatinine Clearance: 45.4 mL/min (A) (by C-G formula based on SCr of 1.28 mg/dL (H)).  LIVER  Recent Labs Lab 10/01/2016 0118  AST 30  ALT 10*  ALKPHOS 61  BILITOT 0.6  PROT 6.9  ALBUMIN 3.9  INR 1.18   INFECTIOUS No results for input(s): LATICACIDVEN, PROCALCITON in the last 168 hours.  ENDOCRINE CBG (last 3)  No results for input(s): GLUCAP in the last 72 hours.  IMAGING x48h  - image(s) personally visualized  -   highlighted in bold Dg Chest Port 1 View  Result Date: 10/03/2016 CLINICAL DATA:  Acute respiratory failure, intubated patient. History of CHF. EXAM: PORTABLE CHEST 1  VIEW COMPARISON:  Portable chest x-ray of October 02, 2016 FINDINGS: The lungs are reasonably well inflated. The retrocardiac region on the left is slightly less dense today. A small left pleural effusion blunts the costophrenic angle laterally. The cardiac silhouette remains enlarged. The pulmonary vascularity is not engorged. There is calcification in the wall of the aortic arch. The endotracheal tube tip lies approximately 1.9 cm above the carina. The esophagogastric tube tip in proximal port project below the GE junction. IMPRESSION: Slight improvement in left lower lobe atelectasis or pneumonia. Stable small left pleural effusion. Stable cardiomegaly without pulmonary vascular congestion. The support tubes are in reasonable position. Thoracic aortic atherosclerosis. Electronically Signed   By: David  [place] M.D.   On: 10/03/2016 07:41   ASSESSMENT and PLAN  Acute respiratory failure with hypoxemia:  - Wean as tolerated  - One way extubation is plan  - DNR status noted  - MSO4 drip for palliation  Neuro: L MCA CVA  - MSO4  Cardiac: A-fib with uner anticogulationand HTN  - MSO4 Chronic CHF  -MSO4 drip    GOC:  - DNR and extubated. 5/2 obtunded and not protecting airway. Consider full palliation.    GI Prophylaxis:  -MSO4  DVT Prophylaxis:  - SCDs  FAMILY  - Updates:No family at bedside 5/2  - Inter-disciplinary family meet or Palliative Care meeting due by:  DAy 7. Current LOS is LOS 5 days  DISPO Consider tx to palliative floor and PCCM will sign off 5/2.    Ricky Guzman ACNP Ricky Guzman PCCM Pager [phone] till 3 pm If no answer page [phone] 10/04/2016, 8:24 AM  Attending Note:  83 year old male with CVA that was made DNR and extubated on 5/1.  Patient decompensated overnight and was started on a morphine drip for comfort.  This AM, patient appears comfortable but clearly has transmitted upper airway sounds (unable to protect his airway).  Family is concerned about lack of  nutrition this AM.  They clearly have little understanding of the situation.  Spoke with family extensively, after discussion they understood that patient is not going to survive and that focus now is comfort rather than cure.  Once they understood that they were much more comfortable with the idea that he will not be receiving nutrition, he is dying and that our focus now is comfort.  PCCM will sign off, please call back if needed.  The patient is critically ill with multiple organ systems failure and requires high complexity decision making for assessment and support, frequent evaluation and titration of therapies, application of advanced monitoring technologies and extensive interpretation of multiple databases.   Critical Care Time devoted to patient care services described in this note is  35  Minutes. This time reflects time of care of this signee Dr Koren Bound. This critical care time does not reflect procedure time, or teaching time or supervisory time of PA/NP/Med student/Med Resident etc but could involve care discussion time.  Alyson Reedy, M.D. [facility] Pulmonary/Critical Care Medicine. Pager: [phone]. After hours pager: [phone].

## 2016-10-04 NOTE — Progress Notes (Signed)
OT Cancellation Note  Patient Details Name: Ricky Guzman MRN: [id number] DOB: November 07, 1930   Cancelled Treatment:    Reason Eval/Treat Not Completed: Other (comment). Pt now on comfort care. OT will sign off. Thanks!  Doristine Section, MS OTR/L  Pager: [phone]   Doristine Section 10/04/2016, 8:44 AM

## 2016-10-04 NOTE — Clinical Social Work Note (Signed)
CSW acknowledges consult for comfort care. CSW will follow progress in the event pt needs residential hospice.   Corlis Hove, LCSWA, LCASA Clinical Social Work [phone]

## 2016-10-04 NOTE — Progress Notes (Signed)
Pt with terminal secretions, unresponsive. Tachycardic and hypertensive at 0800,morphine bolus given and rate increased per order. Mouth care done. Pt turned, he is clean and dry. BP decreased after bolus. No family in room currently, but per night RN, they will return at 0900.

## 2016-10-04 NOTE — Progress Notes (Signed)
SLP Cancellation Note  Patient Details Name: Shivaay Stormont MRN: [id number] DOB: 1930-12-07   Cancelled treatment:       Reason Eval/Treat Not Completed: Other (comment) Pt transitioned to full comfort care overnight. Discussed with RN - will sign off at this time. Please re-order if we can be of assistance.   Maxcine Ham 10/04/2016, 8:59 AM  Maxcine Ham, M.A. CCC-SLP [phone]

## 2016-10-04 NOTE — Progress Notes (Signed)
RN called into room by family. They are requesting nutrition for patient. MD notified and MD Molli Knock discussed plan of care again with family.

## 2016-10-04 NOTE — Progress Notes (Signed)
Responded to consult for end of life. Pt was just transitioned to comfort care, will soon be transferred out to a palliative bed on 6N. Provided spiritual/emotional support and prayer Ephriam Knuckles) to several family members in rm: nieces, nephew, and one of their spouses. Pt is last of the generation ahead of them to die, and youngest of his generation, so they almost considered him one of them. They have many fond memories and stories of him. Advised them they may ask nurse to call for a chaplain again at any time, whether on new floor on in night if they wish when it seems he may pass soon. They were most appreciative.   10/04/16 1100  Clinical Encounter Type  Visited With Patient and family together  Visit Type Follow-up;Psychological support;Spiritual support;Social support;Critical Care  Referral From Nurse  Spiritual Encounters  Spiritual Needs Prayer;Emotional;Grief support  Stress Factors  Patient Stress Factors Health changes;Loss of control;Other (Comment)  Family Stress Factors Family relationships;Health changes;Loss;Loss of control (comfort care)   Ephraim Hamburger, Chaplain

## 2016-10-04 NOTE — Progress Notes (Signed)
PT Cancellation Note  Patient Details Name: Ricky Guzman MRN: [id number] DOB: 09-Jan-1931   Cancelled Treatment:    Reason Eval/Treat Not Completed: Other (comment). PT orders discontinued, pt now on comfort care. Will sign-off.  Dewayne Hatch, SPT Office-[phone]  Ina Homes 10/04/2016, 7:28 AM

## 2016-10-13 ENCOUNTER — Telehealth: Payer: Self-pay

## 2016-10-13 NOTE — Telephone Encounter (Signed)
Rn Programmer, [facility] at [phone]. Rn stated Dr.Sethi receive a death certificate for patient this week. Rn ask if they wanted to come by Monday and pick up the form. Crystal stated they will pick up on Monday. Rn stated it will be in a envelope at [facility].

## 2016-11-03 NOTE — Discharge Summary (Signed)
Patient ID: Ricky Guzman MRN: [id number] DOB/AGE: 81/06/1930 81 y.o.  Admit date: 09/03/2016 Death date: 10/06/2016 4: 38 pm  Admission Diagnoses: Code Stroke  Cause of Death:  Respiratory failure secondary to large left middle cerebral artery infarct with cytotoxic edema secondary to left middle cerebral artery occlusion from atrial fibrillation. Patient made DO NOT RESUSCITATE and comfort care by family  Pertinent Medical Diagnosis: Active Problems:   Chronic atrial fibrillation (HCC)   Stroke (cerebrum) (HCC)   Acute respiratory failure (HCC)   Chronic systolic CHF (congestive heart failure) (HCC)   Goals of care, counseling/discussion   At risk for stress ulcer   Sequential compression device (SCD) in place on both lower extremities   Hospital Course:  Ricky Guzman an 83 y.o.malewho presented with right hemiplegia and aphasia. He was LKN at 2230 and found by family with above symptoms at 0030. BP on EMS arrival was 196/102, with CBG 126. Code Stroke was initiated en route to the ED.  His PMHx includes atrial fibrillation and HTN. On Eliquis 2.5 Mg BID PTA - under dosed. [id number] tPA Given:No: Out of 3 hour IV tPA time window for patients >81 years of age. Patient was taken to IR for attempted endovascular revascularization but due to severe tortuosity of the left internal carotid artery the procedure was aborted and could not mid performed. Patient's condition remained poor and he remained aphasic with dense hemiplegia. Family understood his poor prognosis and did not want prolonged ventilatory support and agreed to DO NOT RESUSCITATE and patient was hence extubated. He did bleed well for only a short period then developed respiratory distress. Family agreed to comfort care and patient was started on morphine drip and transferred from the ICU to the hospice unit. His condition gradually declined and was found to be pulseless and not breathing and pronounced dead on 10/18/2016 at 4:38  PM. Procedures :  MRI  - Motion degraded exam. Acute infarction affecting much of the superior division left MCA territory. Swelling but no hemorrhage or mass effect.   MRA -  appears to show restoration of flow in the major left  middle cerebral branches.  CTA head and neck - left M1/M2 occlusion, right ICA cavernous fusiform dilation    IR attempted but aborted due to tortuosity of the left ICA 2D Echo -Left ventricle: The cavity size was normal. Systolic function was mildly reduced. The estimated ejection fraction was in the range  of 45% to 50%. Diffuse hypokinesis  LDL - 80 HgbA1c - 5.8  Signed: Briannon Boggio 10/11/2016, 8:37 AM

## 2016-11-03 NOTE — Progress Notes (Signed)
Released body to the morgue.

## 2016-11-03 NOTE — Progress Notes (Signed)
Patient is a DNR, unresponsive, no pulse, no respiration,  expired at 15:45, next of kin was at bedside at time of death, Dr. Delia HeadyPramod Sethi was notified, [place] donor also notified.

## 2016-11-03 NOTE — Care Management Note (Signed)
Case Management Note  Patient Details  Name: Pablo Ledgerlmer Haddaway MRN: [id number] Date of Birth: 07/08/1930  Subjective/Objective:                    Action/Plan:   Expected Discharge Date:                  Expected Discharge Plan:  IP Rehab Facility  In-House Referral:     Discharge planning Services  CM Consult  Post Acute Care Choice:    Choice offered to:     DME Arranged:    DME Agency:     HH Arranged:    HH Agency:     Status of Service:  In process, will continue to follow  If discussed at Long Length of Stay Meetings, dates discussed:    Additional Comments:  Yvone NeuCrutchfield, Jayven Naill M, RN 10/20/2016, 1:29 PM

## 2016-11-03 NOTE — Progress Notes (Signed)
STROKE TEAM PROGRESS NOTE   HISTORY OF PRESENT ILLNESS (per record) Ricky Guzman is an 83 y.o. male who presented with right hemiplegia and aphasia. He was LKN at 2230 and found by family with above symptoms at 0030. BP on EMS arrival was 196/102, with CBG 126. Code Stroke was initiated en route to the ED.  His PMHx includes atrial fibrillation and HTN. On Eliquis 2.5 Mg BID PTA - under dosed.  LSN: 2230 tPA Given: No: Out of 3 hour IV tPA time window for patients > 16 years of age.    SUBJECTIVE (INTERVAL HISTORY) His multiple family members are  at the bedside. Patient is not transferred to the hospice floor. He is on morphine drip and appears comfortable. Temp:  [103.6 F (39.8 C)] 103.6 F (39.8 C) (05/03 1406) Pulse Rate:  [74-133] 115 (05/03 1406) Resp:  [12-14] 12 (05/03 0444) BP: (104-107)/(57-69) 107/59 (05/03 1406) SpO2:  [75 %-88 %] 83 % (05/03 1406)  CBC:   Recent Labs Lab 10/02/16 0217 10/03/16 0605  WBC 9.5 7.9  NEUTROABS 6.8 5.7  HGB 12.2* 12.6*  HCT 37.6* 37.9*  MCV 89.5 89.0  PLT 164 180    Basic Metabolic Panel:   Recent Labs Lab 10/02/16 0217 10/03/16 0605  NA 141 142  K 3.7 3.7  CL 107 106  CO2 26 25  GLUCOSE 109* 101*  BUN 11 22*  CREATININE 1.27* 1.28*  CALCIUM 8.3* 8.7*  MG 2.1 2.2  PHOS 2.8 3.3    Lipid Panel:     Component Value Date/Time   CHOL 137 10-07-2016 0545   TRIG 118 Oct 07, 2016 0545   HDL 33 (L) 2016/10/07 0545   CHOLHDL 4.2 10/07/2016 0545   VLDL 24 2016-10-07 0545   LDLCALC 80 Oct 07, 2016 0545   HgbA1c:  Lab Results  Component Value Date   HGBA1C 5.8 (H) 10/07/2016   Urine Drug Screen: No results found for: LABOPIA, COCAINSCRNUR, LABBENZ, AMPHETMU, THCU, LABBARB  Alcohol Level No results found for: [facility]   IMAGING I have personally reviewed the radiological images below and agree with the radiology interpretations.  Ct Angio Head W Or Wo Contrast Ct Angio Neck W And/or Wo Contrast 10/07/2016 1. Occlusion of  the left middle cerebral artery M2 segment superior division 7 mm distal to the MCA bifurcation. Attenuated distal left MCA distribution vascularity.  2. Fusiform dilatation of the anterior genu of the Rt ICA cavernous segment, measuring 8 mm.  3. Normal cervical CTA.  4. Aortic atherosclerosis.   Ir Percutaneous Art Thrombectomy/infusion Intracranial Inc Diag Angio Ir US Guide Vasc Access Left  10-07-2016 Status post left cerebral angiogram demonstrating common origin of the left common carotid artery from the innominate artery as well as significantly tortuous proximal left ICA, precluding placement of catheter system into the MCA with inability to treat the M2 occlusion.     Portable Chest Xray Oct 07, 2016 Low volumes with left lung central basilar atelectasis. Prominent mediastinum likely due to low volumes. Endotracheal tube. Cardiomegaly without decompensation.    Ct Head Code Stroke W/o Cm 2016-10-07   1. No acute intracranial hemorrhage.  2. Old right occipital infarct, chronic microvascular ischemia and cerebral atrophy.  3. ASPECTS is 10.   TTE Left ventricle: The cavity size was normal. Systolic function was   mildly reduced. The estimated ejection fraction was in the range   of 45% to 50%. Diffuse hypokinesis MRI  Motion degraded exam. Acute infarction affecting much of the superior division left MCA territory. Swelling  but no hemorrhage or mass effect. MRA : appears to show restoration of flow in the major left middle cerebral branches. PHYSICAL EXAM  Temp:  [103.6 F (39.8 C)] 103.6 F (39.8 C) (05/03 1406) Pulse Rate:  [74-133] 115 (05/03 1406) Resp:  [12-14] 12 (05/03 0444) BP: (104-107)/(57-69) 107/59 (05/03 1406) SpO2:  [75 %-88 %] 83 % (05/03 1406)  General - Well nourished, well developed, elderly male   Ophthalmologic - Fundi not visualized.  Cardiovascular - irregularly irregular heart rate and rhythm.  Neuro - extubated on morphine drip for  sedation,  barely open eyes on voice and pain,  Not  following  any commands. Left eye gaze preference, PERRL, doll's eyes positive, not blinking to visual threat with forced eye opening, not tracking. Right corneal absent, but left corneal and gag positive. On pain stimulation, withdraws minimally all extremities left more than right   at least 2+/5. DTR 1+ and bilateral babinski positive. sensation, coordination and gait not tested.    ASSESSMENT/PLAN [name] is a 83 y.o. male with history of small bowel obstruction, rectal bleeding, hypertension, atrial fibrillation (Eliquis - under dosed), and old stroke by imaging presenting with right hemiplegia and aphasia. He did not receive IV t-PA due to late presentation. Attempted but unsuccessful thrombectomy.  Stroke:  Left MCA infarct with M1/M2 junction occlusion - embolic - secondary to afib with under-dosed eliquis  Resultant aphasia  MRI  - Motion degraded exam. Acute infarction affecting much of the superior division left MCA territory. Swelling but no hemorrhage or mass effect.   MRA -  appears to show restoration of flow in the major left  middle cerebral branches.  CTA head and neck - left M1/M2 occlusion, right ICA cavernous fusiform dilation    IR attempted but aborted due to tortuosity of the left ICA 2D Echo -Left ventricle: The cavity size was normal. Systolic function was   mildly reduced. The estimated ejection fraction was in the range    of 45% to 50%. Diffuse hypokinesis  LDL - 80  HgbA1c - 5.8  VTE prophylaxis - SCDs Diet NPO time specified  eliquis 2.5mg  bid prior to admission, now on aspirin 325 mg daily.  Patient counseled to be compliant with his antithrombotic medications  Ongoing aggressive stroke risk factor management  Therapy recommendations: pending  Disposition: Pending  afib on eliquis  Pt eliquis 2.5mg  bid PTA was under dosed as pt weight > 60Kg and Cre < 1.5.   Hold off anticoagulation for  now pending MRI to evaluate infarct size  On ASA 300mg  PR  Consider resuming Eliquis 5mg  bid once appropirate.  Respiratory failure  Intubated for intervention  CCM on board  Evaluating possible extubation after sheath removal.   Hypertension  Stable  Permissive hypertension (OK if < 180/105 due to groin sheath) but gradually normalize in 5-7 days  Long-term BP goal 130-150 due to left MCA M1/M2 occlusion  Hyperlipidemia  Home meds: No lipid lowering medications prior to admission  LDL 80, goal < 70  Continue Pravachol 20 mg daily  Continue statin at discharge  Other Stroke Risk Factors  Advanced age  ETOH use, advised to drink no more than 1drink per day  Obesity, Body mass index is 32.42 kg/m., recommend weight loss, diet and exercise as appropriate   Hx stroke/TIA - by imaging  Atrial fib  Other Active Problems  Fusiform dilatation of the anterior genu of the Rt ICA cavernous segment, measuring 8 mm.  Mild leukocytosis -  12.7 -> 10.0 oral temp 98.6  Hypokalemia - 3.2 -> 3.4 -> 3.0 -> supplement -> recheck in AM  Hypophosphatemia - 1.6  Elevated creatinine - 1.30 ->1.44 -> 0.89 -> 0.95  NPO -> NG tube  Intubated  Hospital day # 6 Plan  Family ok with plan for comfort care and morphine drip. Will consult social worker to look at hospice nursing home. Transfer to hospice nursing home when bed available  Delia Heady, MD  Medical Director [facility] Pager: [phone] 2016-11-04 3:52 PM  Phone [phone] Fax [phone] To contact Stroke Continuity provider, please refer to [email or web address]. After hours, contact General Neurology

## 2016-11-03 DEATH — deceased

## 2019-01-14 IMAGING — CR DG CHEST 1V PORT
1 series · 1 of 1 positions shown · non-contrast
Comparison: September 30, 2016

CLINICAL DATA: Hypoxia

EXAM:
PORTABLE CHEST 1 VIEW

[AP]
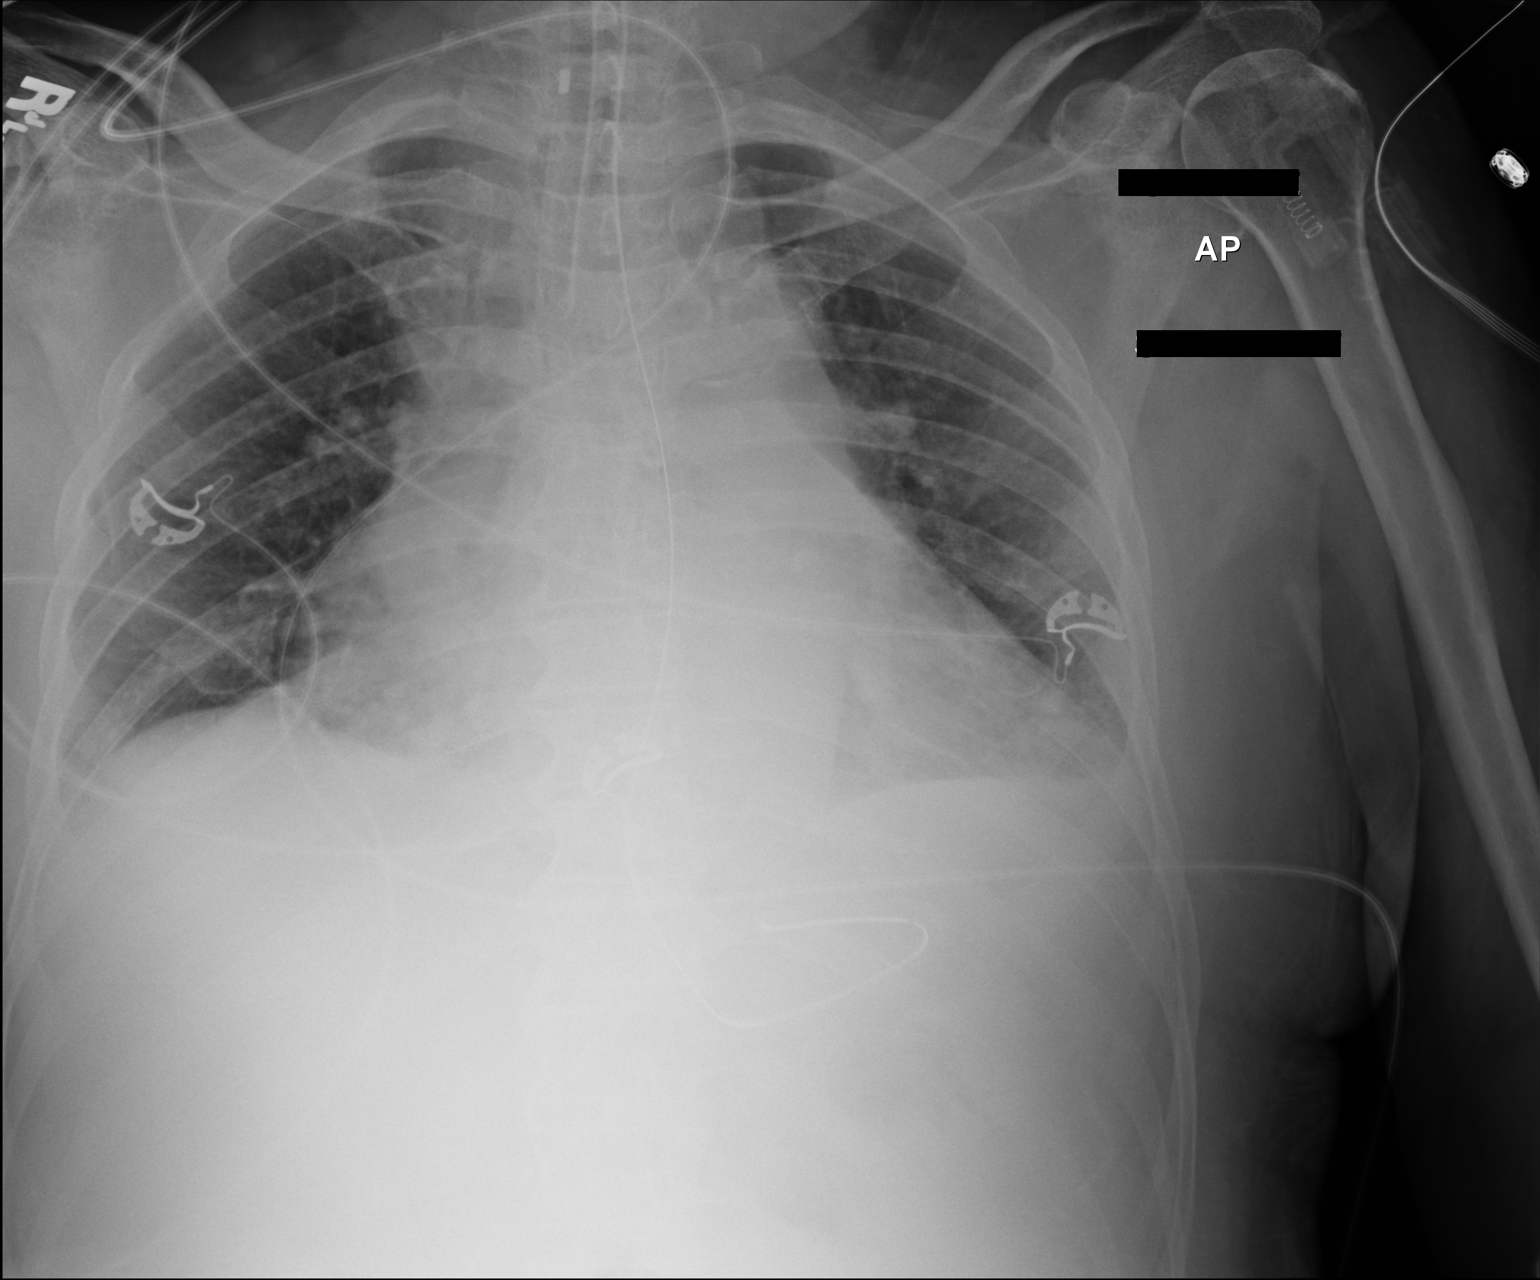

[1 of 1 positions shown; findings below may reference images not displayed]

FINDINGS: Endotracheal tube tip is 2.3 cm above the carina. Nasogastric tube
tip and side port are in the stomach. No pneumothorax. There is left
base atelectasis with small left pleural effusion. Right lung is
clear. There is cardiomegaly with pulmonary vascularity within
normal limits. No adenopathy. No bone lesions. There is aortic
atherosclerosis.
IMPRESSION: Tube positions as described without evident pneumothorax. Small left
pleural effusion with left base atelectasis. No frank edema or
consolidation. Stable cardiomegaly. There is aortic atherosclerosis.

## 2019-01-16 IMAGING — DX DG CHEST 1V PORT
1 series · 1 of 1 positions shown · non-contrast
Comparison: Portable chest x-ray October 02, 2016

CLINICAL DATA: Acute respiratory failure, intubated patient.
History of CHF.

EXAM:
PORTABLE CHEST 1 VIEW

[chest ap]
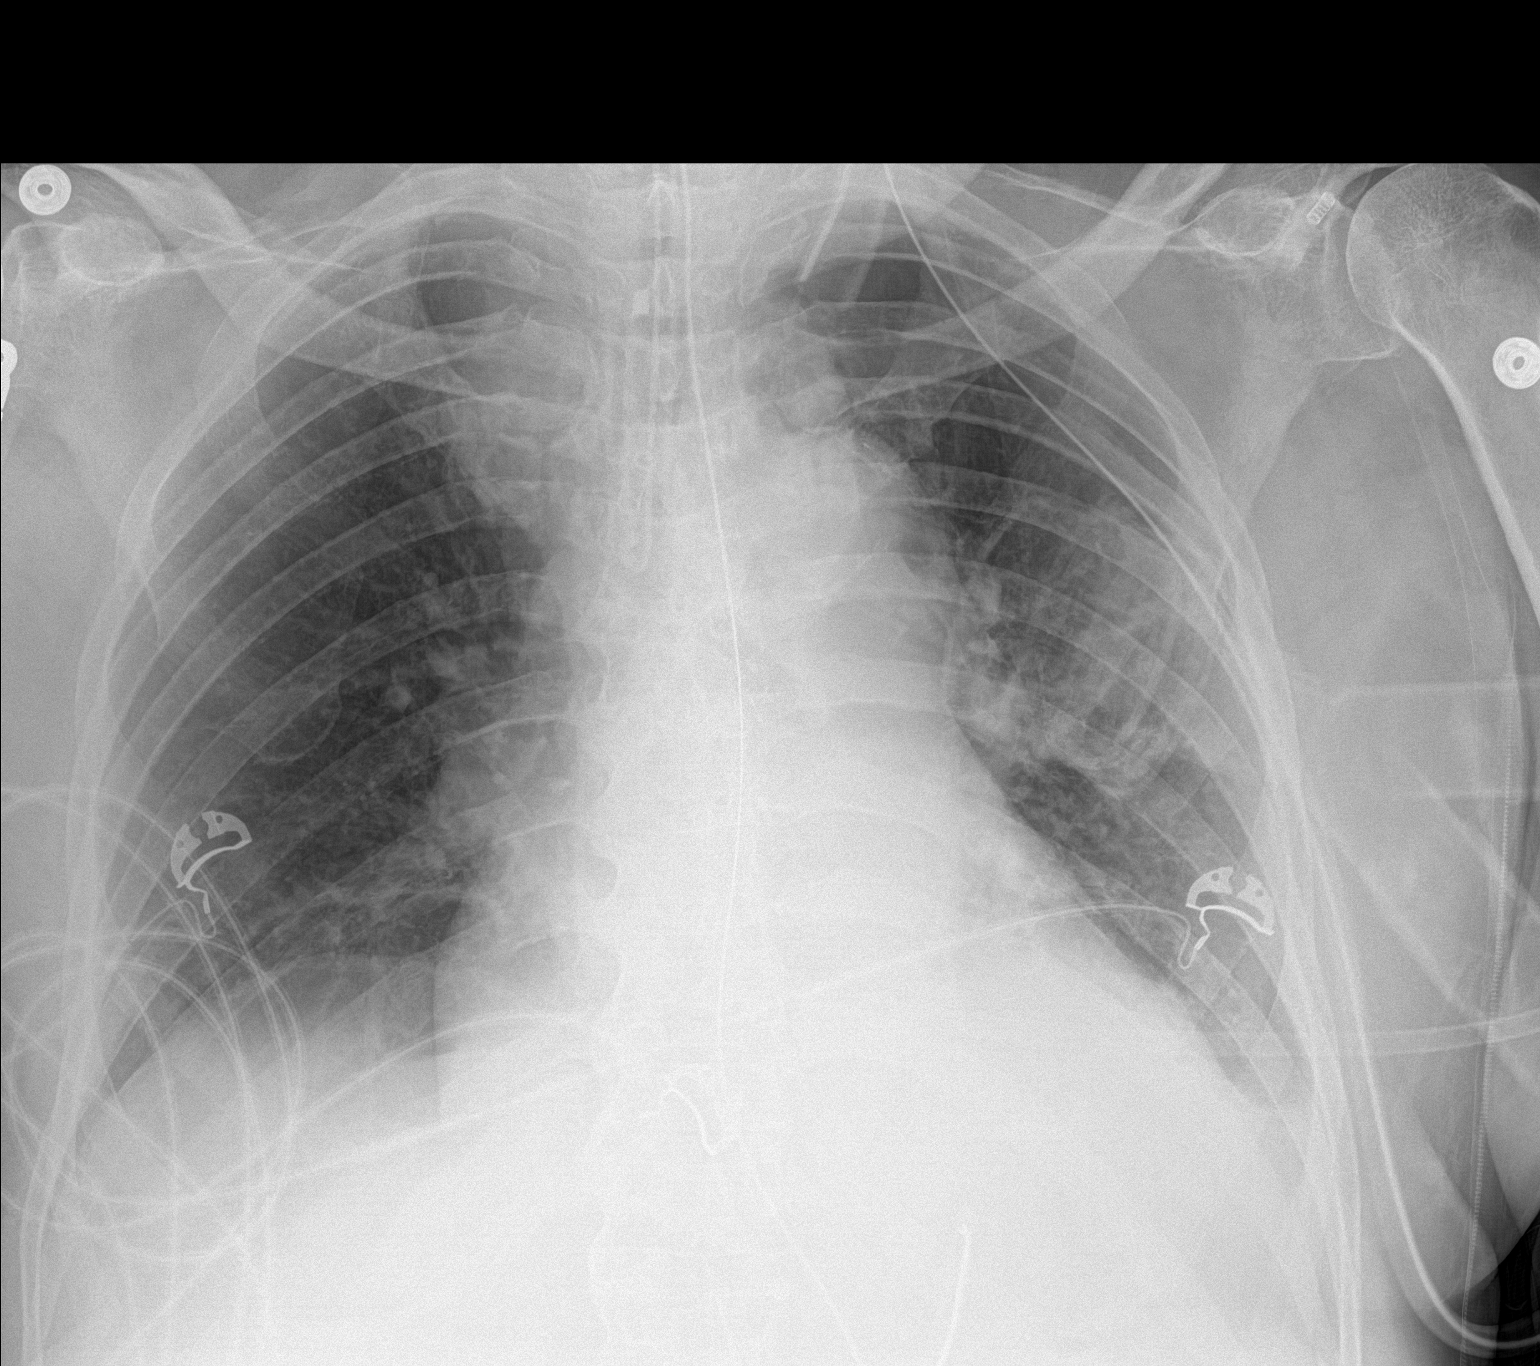

[1 of 1 positions shown; findings below may reference images not displayed]

FINDINGS: The lungs are reasonably well inflated. The retrocardiac region on
the left is slightly less dense today. A small left pleural effusion
blunts the costophrenic angle laterally. The cardiac silhouette
remains enlarged. The pulmonary vascularity is not engorged. There
is calcification in the wall of the aortic arch. The endotracheal
tube tip lies approximately 1.9 cm above the carina. The
esophagogastric tube tip in proximal port project below the GE
junction.
IMPRESSION: Slight improvement in left lower lobe atelectasis or pneumonia.
Stable small left pleural effusion. Stable cardiomegaly without
pulmonary vascular congestion. The support tubes are in reasonable
position.

Thoracic aortic atherosclerosis.
